# Patient Record
Sex: Female | Born: 1980 | Race: White | Hispanic: No | Marital: Married | Smoking: Former smoker
Health system: Southern US, Community
[De-identification: ages and names within clinical notes are randomized; demographics above are authoritative.]

## PROBLEM LIST (undated history)

## (undated) DIAGNOSIS — E559 Vitamin D deficiency, unspecified: Secondary | ICD-10-CM

## (undated) DIAGNOSIS — J811 Chronic pulmonary edema: Secondary | ICD-10-CM

## (undated) DIAGNOSIS — L814 Other melanin hyperpigmentation: Secondary | ICD-10-CM

## (undated) DIAGNOSIS — O2621 Pregnancy care for patient with recurrent pregnancy loss, first trimester: Secondary | ICD-10-CM

## (undated) DIAGNOSIS — O99810 Abnormal glucose complicating pregnancy: Secondary | ICD-10-CM

## (undated) DIAGNOSIS — I10 Essential (primary) hypertension: Secondary | ICD-10-CM

## (undated) DIAGNOSIS — M5136 Other intervertebral disc degeneration, lumbar region: Secondary | ICD-10-CM

## (undated) DIAGNOSIS — N390 Urinary tract infection, site not specified: Secondary | ICD-10-CM

## (undated) DIAGNOSIS — G4733 Obstructive sleep apnea (adult) (pediatric): Secondary | ICD-10-CM

## (undated) DIAGNOSIS — R000 Tachycardia, unspecified: Secondary | ICD-10-CM

## (undated) DIAGNOSIS — G8929 Other chronic pain: Secondary | ICD-10-CM

## (undated) DIAGNOSIS — R102 Pelvic and perineal pain: Secondary | ICD-10-CM

## (undated) DIAGNOSIS — N939 Abnormal uterine and vaginal bleeding, unspecified: Secondary | ICD-10-CM

## (undated) DIAGNOSIS — Z315 Encounter for genetic counseling: Secondary | ICD-10-CM

## (undated) DIAGNOSIS — Z1589 Genetic susceptibility to other disease: Secondary | ICD-10-CM

## (undated) DIAGNOSIS — F419 Anxiety disorder, unspecified: Secondary | ICD-10-CM

## (undated) DIAGNOSIS — M160 Bilateral primary osteoarthritis of hip: Secondary | ICD-10-CM

## (undated) DIAGNOSIS — M4126 Other idiopathic scoliosis, lumbar region: Secondary | ICD-10-CM

## (undated) DIAGNOSIS — R2243 Localized swelling, mass and lump, lower limb, bilateral: Secondary | ICD-10-CM

## (undated) DIAGNOSIS — N643 Galactorrhea not associated with childbirth: Secondary | ICD-10-CM

## (undated) DIAGNOSIS — N83209 Unspecified ovarian cyst, unspecified side: Secondary | ICD-10-CM

## (undated) DIAGNOSIS — O09521 Supervision of elderly multigravida, first trimester: Secondary | ICD-10-CM

## (undated) DIAGNOSIS — E041 Nontoxic single thyroid nodule: Secondary | ICD-10-CM

## (undated) DIAGNOSIS — E6609 Other obesity due to excess calories: Secondary | ICD-10-CM

## (undated) DIAGNOSIS — I341 Nonrheumatic mitral (valve) prolapse: Secondary | ICD-10-CM

## (undated) DIAGNOSIS — R5381 Other malaise: Secondary | ICD-10-CM

## (undated) DIAGNOSIS — F329 Major depressive disorder, single episode, unspecified: Secondary | ICD-10-CM

## (undated) HISTORY — PX: PARATHYROIDECTOMY: SHX19

---
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
## 2014-05-14 HISTORY — PX: OTHER SURGICAL HISTORY: SHX169

## 2014-08-20 ENCOUNTER — Other Ambulatory Visit (HOSPITAL_COMMUNITY): Payer: Self-pay | Admitting: Obstetrics and Gynecology

## 2014-08-20 DIAGNOSIS — R102 Pelvic and perineal pain: Secondary | ICD-10-CM

## 2014-08-20 DIAGNOSIS — N939 Abnormal uterine and vaginal bleeding, unspecified: Secondary | ICD-10-CM

## 2014-08-25 ENCOUNTER — Ambulatory Visit (HOSPITAL_COMMUNITY)
Admission: RE | Admit: 2014-08-25 | Discharge: 2014-08-25 | Disposition: A | Discharge status: Home / Self Care | Payer: Medicaid Other | Source: Ambulatory Visit | Attending: Obstetrics and Gynecology | Admitting: Obstetrics and Gynecology

## 2014-08-25 ENCOUNTER — Other Ambulatory Visit (HOSPITAL_COMMUNITY): Payer: Self-pay | Admitting: Obstetrics and Gynecology

## 2014-08-25 DIAGNOSIS — R102 Pelvic and perineal pain: Secondary | ICD-10-CM

## 2014-08-25 DIAGNOSIS — D251 Intramural leiomyoma of uterus: Secondary | ICD-10-CM | POA: Diagnosis not present

## 2014-08-25 DIAGNOSIS — N979 Female infertility, unspecified: Secondary | ICD-10-CM | POA: Insufficient documentation

## 2014-08-25 DIAGNOSIS — N939 Abnormal uterine and vaginal bleeding, unspecified: Secondary | ICD-10-CM

## 2014-08-30 ENCOUNTER — Ambulatory Visit (HOSPITAL_COMMUNITY): Payer: Self-pay

## 2014-12-08 ENCOUNTER — Encounter (HOSPITAL_COMMUNITY): Payer: Self-pay | Admitting: Radiology

## 2014-12-08 ENCOUNTER — Ambulatory Visit (HOSPITAL_COMMUNITY): Payer: 59 | Admitting: Anesthesiology

## 2014-12-08 ENCOUNTER — Ambulatory Visit (HOSPITAL_COMMUNITY): Payer: 59

## 2014-12-08 ENCOUNTER — Encounter (HOSPITAL_COMMUNITY)
Admission: RE | Disposition: A | Discharge status: Home / Self Care | Payer: Self-pay | Source: Ambulatory Visit | Attending: Obstetrics and Gynecology

## 2014-12-08 ENCOUNTER — Ambulatory Visit (HOSPITAL_COMMUNITY)
Admission: RE | Admit: 2014-12-08 | Discharge: 2014-12-08 | Disposition: A | Discharge status: Home / Self Care | Payer: 59 | Source: Ambulatory Visit | Attending: Obstetrics and Gynecology | Admitting: Obstetrics and Gynecology

## 2014-12-08 DIAGNOSIS — IMO0002 Reserved for concepts with insufficient information to code with codable children: Secondary | ICD-10-CM

## 2014-12-08 DIAGNOSIS — Z3A12 12 weeks gestation of pregnancy: Secondary | ICD-10-CM | POA: Insufficient documentation

## 2014-12-08 DIAGNOSIS — O021 Missed abortion: Secondary | ICD-10-CM | POA: Diagnosis not present

## 2014-12-08 HISTORY — PX: DILATION AND EVACUATION: SHX1459

## 2014-12-08 LAB — CBC
HCT: 38.6 % (ref 36.0–46.0)
HEMOGLOBIN: 13.1 g/dL (ref 12.0–15.0)
MCH: 31.1 pg (ref 26.0–34.0)
MCHC: 33.9 g/dL (ref 30.0–36.0)
MCV: 91.7 fL (ref 78.0–100.0)
Platelets: 160 10*3/uL (ref 150–400)
RBC: 4.21 MIL/uL (ref 3.87–5.11)
RDW: 12.9 % (ref 11.5–15.5)
WBC: 6.9 10*3/uL (ref 4.0–10.5)

## 2014-12-08 SURGERY — DILATION AND EVACUATION, UTERUS
Anesthesia: General | Site: Vagina

## 2014-12-08 MED ORDER — IBUPROFEN 600 MG PO TABS: 600.0000 mg | ORAL_TABLET | Freq: Four times a day (QID) | ORAL | Status: DC | PRN

## 2014-12-08 MED ORDER — SCOPOLAMINE 1 MG/3DAYS TD PT72: 1.0000 | MEDICATED_PATCH | Freq: Once | TRANSDERMAL | Status: DC

## 2014-12-08 MED ORDER — PROPOFOL 10 MG/ML IV BOLUS
INTRAVENOUS | Status: DC | PRN
  Administered 2014-12-08: 200 mg via INTRAVENOUS

## 2014-12-08 MED ORDER — FENTANYL CITRATE (PF) 100 MCG/2ML IJ SOLN: 25.0000 ug | INTRAMUSCULAR | Status: DC | PRN

## 2014-12-08 MED ORDER — DEXAMETHASONE SODIUM PHOSPHATE 10 MG/ML IJ SOLN
INTRAMUSCULAR | Status: AC
  Filled 2014-12-08: qty 1

## 2014-12-08 MED ORDER — LIDOCAINE HCL 1 % IJ SOLN
INTRAMUSCULAR | Status: DC | PRN
  Administered 2014-12-08: 10 mL

## 2014-12-08 MED ORDER — DEXAMETHASONE SODIUM PHOSPHATE 10 MG/ML IJ SOLN
INTRAMUSCULAR | Status: DC | PRN
  Administered 2014-12-08: 10 mg via INTRAVENOUS

## 2014-12-08 MED ORDER — LIDOCAINE HCL (CARDIAC) 20 MG/ML IV SOLN
INTRAVENOUS | Status: AC
  Filled 2014-12-08: qty 5

## 2014-12-08 MED ORDER — SUCCINYLCHOLINE CHLORIDE 200 MG/10ML IV SOSY
PREFILLED_SYRINGE | INTRAVENOUS | Status: DC | PRN
  Administered 2014-12-08: 80 mg via INTRAVENOUS

## 2014-12-08 MED ORDER — HYDROCODONE-ACETAMINOPHEN 5-325 MG PO TABS: ORAL_TABLET | ORAL | Status: DC

## 2014-12-08 MED ORDER — PROPOFOL 10 MG/ML IV BOLUS
INTRAVENOUS | Status: AC
  Filled 2014-12-08: qty 20

## 2014-12-08 MED ORDER — LIDOCAINE HCL 1 % IJ SOLN
INTRAMUSCULAR | Status: AC
  Filled 2014-12-08: qty 20

## 2014-12-08 MED ORDER — ONDANSETRON HCL 4 MG/2ML IJ SOLN
INTRAMUSCULAR | Status: DC | PRN
  Administered 2014-12-08: 4 mg via INTRAVENOUS

## 2014-12-08 MED ORDER — MIDAZOLAM HCL 5 MG/5ML IJ SOLN
INTRAMUSCULAR | Status: DC | PRN
  Administered 2014-12-08: 2 mg via INTRAVENOUS

## 2014-12-08 MED ORDER — LIDOCAINE HCL (CARDIAC) 20 MG/ML IV SOLN
INTRAVENOUS | Status: DC | PRN
  Administered 2014-12-08: 40 mg via INTRAVENOUS

## 2014-12-08 MED ORDER — ONDANSETRON HCL 4 MG/2ML IJ SOLN
INTRAMUSCULAR | Status: AC
  Filled 2014-12-08: qty 2

## 2014-12-08 MED ORDER — MIDAZOLAM HCL 2 MG/2ML IJ SOLN
INTRAMUSCULAR | Status: AC
  Filled 2014-12-08: qty 2

## 2014-12-08 MED ORDER — LACTATED RINGERS IV SOLN
INTRAVENOUS | Status: DC
  Administered 2014-12-08 (×2): via INTRAVENOUS

## 2014-12-08 MED ORDER — KETOROLAC TROMETHAMINE 30 MG/ML IJ SOLN
INTRAMUSCULAR | Status: DC | PRN
  Administered 2014-12-08: 30 mg via INTRAVENOUS

## 2014-12-08 MED ORDER — FENTANYL CITRATE (PF) 100 MCG/2ML IJ SOLN
INTRAMUSCULAR | Status: DC | PRN
  Administered 2014-12-08: 100 ug via INTRAVENOUS

## 2014-12-08 MED ORDER — FENTANYL CITRATE (PF) 100 MCG/2ML IJ SOLN
INTRAMUSCULAR | Status: AC
  Filled 2014-12-08: qty 2

## 2014-12-08 MED ORDER — ONDANSETRON HCL 4 MG/2ML IJ SOLN
4.0000 mg | Freq: Once | INTRAMUSCULAR | Status: DC | PRN
Start: 2014-12-08 — End: 2014-12-08

## 2014-12-08 MED ORDER — SUCCINYLCHOLINE CHLORIDE 20 MG/ML IJ SOLN
INTRAMUSCULAR | Status: AC
  Filled 2014-12-08: qty 1

## 2014-12-08 SURGICAL SUPPLY — 17 items
CATH ROBINSON RED A/P 16FR (CATHETERS) ×3 IMPLANT
CLOTH BEACON ORANGE TIMEOUT ST (SAFETY) ×3 IMPLANT
DECANTER SPIKE VIAL GLASS SM (MISCELLANEOUS) ×3 IMPLANT
DILATOR CANAL MILEX (MISCELLANEOUS) IMPLANT
GLOVE BIO SURGEON STRL SZ7 (GLOVE) ×3 IMPLANT
GOWN STRL REUS W/TWL LRG LVL3 (GOWN DISPOSABLE) ×6 IMPLANT
KIT BERKELEY 1ST TRIMESTER 3/8 (MISCELLANEOUS) ×3 IMPLANT
NS IRRIG 1000ML POUR BTL (IV SOLUTION) ×3 IMPLANT
PACK VAGINAL MINOR WOMEN LF (CUSTOM PROCEDURE TRAY) ×3 IMPLANT
PAD OB MATERNITY 4.3X12.25 (PERSONAL CARE ITEMS) ×3 IMPLANT
PAD PREP 24X48 CUFFED NSTRL (MISCELLANEOUS) ×3 IMPLANT
SET BERKELEY SUCTION TUBING (SUCTIONS) ×3 IMPLANT
TOWEL OR 17X24 6PK STRL BLUE (TOWEL DISPOSABLE) ×6 IMPLANT
VACURETTE 10 RIGID CVD (CANNULA) ×3 IMPLANT
VACURETTE 7MM CVD STRL WRAP (CANNULA) IMPLANT
VACURETTE 8 RIGID CVD (CANNULA) IMPLANT
VACURETTE 9 RIGID CVD (CANNULA) IMPLANT

## 2014-12-08 NOTE — Anesthesia Procedure Notes (Signed)
Procedure Name: Intubation Date/Time: 12/08/2014 12:07 PM Performed by: Ignacia Bayley Pre-anesthesia Checklist: Patient identified, Emergency Drugs available, Suction available and Patient being monitored Patient Re-evaluated:Patient Re-evaluated prior to inductionOxygen Delivery Method: Circle system utilized Preoxygenation: Pre-oxygenation with 100% oxygen Intubation Type: IV induction Ventilation: Mask ventilation without difficulty Laryngoscope Size: Miller and 2 Grade View: Grade II Tube type: Oral Tube size: 7.0 mm Number of attempts: 1 Airway Equipment and Method: Stylet Placement Confirmation: ETT inserted through vocal cords under direct vision,  positive ETCO2 and breath sounds checked- equal and bilateral Secured at: 20 cm Tube secured with: Tape Dental Injury: Teeth and Oropharynx as per pre-operative assessment

## 2014-12-08 NOTE — Transfer of Care (Signed)
Immediate Anesthesia Transfer of Care Note  Patient: Claire Sharp  Procedure(s) Performed: Procedure(s): DILATATION AND EVACUATION (N/A)  Patient Location: PACU  Anesthesia Type:General  Level of Consciousness: awake  Airway & Oxygen Therapy: Patient Spontanous Breathing and Patient connected to nasal cannula oxygen  Post-op Assessment: Report given to RN and Post -op Vital signs reviewed and stable  Post vital signs: stable  Last Vitals:  Filed Vitals:   12/08/14 1104  BP: 123/79  Pulse: 59  Temp: 36.8 C  Resp: 20    Complications: No apparent anesthesia complications

## 2014-12-08 NOTE — Op Note (Signed)
Pre-Operative Diagnosis:  12 week miscarriage Postoperative Diagnosis: Same Procedure: Suction Dilation and evacuation Surgeon: Dr. Vanessa Kick Assistant: None Operative Findings: 14-16 week sized uterus Specimen: POCs EBL: 200cc   Claire Sharp Is a 34 year old gravida 5 para 3013 who presents for definitive surgical management for 12 week miscarriage. Please see the patient's history and physical for complete details of the history. Management options were discussed with the patient. R/B/A reviewed. Following appropriate informed consent was taken to the operating room. The patient was appropriately identified during a time out procedure. General anesthesia was administered and the patient was placed in the dorsal lithotomy position. The patient was prepped and draped in the normal sterile fashion. A speculum was placed into the vagina, a single-tooth tenaculum was placed on the anterior lip of the cervix, and 10 cc of 1% lidocaine was administered in a paracervical fashion. The cervix was serially dilated with Hank and Pratt dilators.A #12 suction curet was then passed to the fundus, the vacuum was engaged, and 3 suction passes were performed with a curette. A Sharp curettage was performed and a gritty texture was noted. A final suction pass was performed with minimal results. This completed the procedure. The patient tolerated the procedure well was brought to the recovery room in stable condition for the procedure. All sponge and needle counts correct x2.

## 2014-12-08 NOTE — Anesthesia Postprocedure Evaluation (Signed)
  Anesthesia Post-op Note  Patient: Claire Sharp  Procedure(s) Performed: Procedure(s): DILATATION AND EVACUATION (N/A)  Patient Location: PACU  Anesthesia Type:General  Level of Consciousness: awake, alert  and oriented  Airway and Oxygen Therapy: Patient Spontanous Breathing  Post-op Pain: mild  Post-op Assessment: Post-op Vital signs reviewed, Patient's Cardiovascular Status Stable, Respiratory Function Stable, Patent Airway, No signs of Nausea or vomiting and Pain level controlled              Post-op Vital Signs: Reviewed and stable  Last Vitals:  Filed Vitals:   12/08/14 1315  BP: 110/65  Pulse: 72  Temp:   Resp: 16    Complications: No apparent anesthesia complications

## 2014-12-08 NOTE — Anesthesia Preprocedure Evaluation (Signed)
Anesthesia Evaluation  Patient identified by MRN, date of birth, ID band Patient awake    Reviewed: Allergy & Precautions, NPO status , Patient's Chart, lab work & pertinent test results  History of Anesthesia Complications Negative for: history of anesthetic complications  Airway Mallampati: II  TM Distance: >3 FB Neck ROM: Full    Dental no notable dental hx. (+) Dental Advisory Given   Pulmonary neg pulmonary ROS,  breath sounds clear to auscultation  Pulmonary exam normal       Cardiovascular negative cardio ROS Normal cardiovascular examRhythm:Regular Rate:Normal     Neuro/Psych negative neurological ROS  negative psych ROS   GI/Hepatic negative GI ROS, Neg liver ROS,   Endo/Other  negative endocrine ROS  Renal/GU negative Renal ROS  negative genitourinary   Musculoskeletal negative musculoskeletal ROS (+)   Abdominal   Peds negative pediatric ROS (+)  Hematology negative hematology ROS (+)   Anesthesia Other Findings   Reproductive/Obstetrics (+) Pregnancy 12 weeks and 2 days, missed ab                             Anesthesia Physical Anesthesia Plan  ASA: II  Anesthesia Plan: General   Post-op Pain Management:    Induction: Intravenous  Airway Management Planned: Oral ETT  Additional Equipment:   Intra-op Plan:   Post-operative Plan: Extubation in OR  Informed Consent: I have reviewed the patients History and Physical, chart, labs and discussed the procedure including the risks, benefits and alternatives for the proposed anesthesia with the patient or authorized representative who has indicated his/her understanding and acceptance.   Dental advisory given  Plan Discussed with: CRNA  Anesthesia Plan Comments:         Anesthesia Quick Evaluation

## 2014-12-08 NOTE — H&P (Signed)
Claire Sharp is an 34 y.o. female. For suction D&E  34 yo [id number] presents for surgical management of 12 week missed abortion. Pt presented to office for routine prenatal [place] and no fetal cardiac activity was identified. Given advanced gestation the decion was made to proceed with Suction D&E. R/B?A reviewed.  No LMP recorded. Patient is pregnant.    No past medical history on file.  No past surgical history on file.  No family history on file.  Social History:  has no tobacco, alcohol, and drug history on file.  Allergies: No Known Allergies  Prescriptions prior to admission  Medication Sig Dispense Refill Last Dose  . acetaminophen (TYLENOL) 500 MG tablet Take 1,000 mg by mouth every 8 (eight) hours as needed for mild pain or headache.       ROS: As above  There were no vitals taken for this visit. Physical Exam   AoX3, NAD   Results for orders placed or performed during the hospital encounter of 12/08/14 (from the past 24 hour(s))  CBC     Status: None   Collection Time: 12/08/14 10:10 AM  Result Value Ref Range   WBC 6.9 4.0 - 10.5 K/uL   RBC 4.21 3.87 - 5.11 MIL/uL   Hemoglobin 13.1 12.0 - 15.0 g/dL   HCT 38.6 36.0 - 46.0 %   MCV 91.7 78.0 - 100.0 fL   MCH 31.1 26.0 - 34.0 pg   MCHC 33.9 30.0 - 36.0 g/dL   RDW 12.9 11.5 - 15.5 %   Platelets 160 150 - 400 K/uL    No results found.  Assessment/Plan: 1) Suction D&E 2) SCDs to OR  Claire Sharp H. 12/08/2014, 10:40 AM

## 2014-12-09 ENCOUNTER — Encounter (HOSPITAL_COMMUNITY): Payer: Self-pay | Admitting: Obstetrics and Gynecology

## 2016-10-05 DIAGNOSIS — E041 Nontoxic single thyroid nodule: Secondary | ICD-10-CM

## 2016-10-05 DIAGNOSIS — E559 Vitamin D deficiency, unspecified: Secondary | ICD-10-CM

## 2016-10-05 HISTORY — DX: Vitamin D deficiency, unspecified: E55.9

## 2016-10-05 HISTORY — DX: Nontoxic single thyroid nodule: E04.1

## 2017-07-24 DIAGNOSIS — Z6837 Body mass index (BMI) 37.0-37.9, adult: Secondary | ICD-10-CM

## 2017-07-24 DIAGNOSIS — I341 Nonrheumatic mitral (valve) prolapse: Secondary | ICD-10-CM

## 2017-07-24 DIAGNOSIS — R5381 Other malaise: Secondary | ICD-10-CM | POA: Insufficient documentation

## 2017-07-24 DIAGNOSIS — R5383 Other fatigue: Secondary | ICD-10-CM | POA: Insufficient documentation

## 2017-07-24 DIAGNOSIS — E6609 Other obesity due to excess calories: Secondary | ICD-10-CM

## 2017-07-24 DIAGNOSIS — G4733 Obstructive sleep apnea (adult) (pediatric): Secondary | ICD-10-CM | POA: Insufficient documentation

## 2017-07-24 DIAGNOSIS — F419 Anxiety disorder, unspecified: Secondary | ICD-10-CM

## 2017-07-24 DIAGNOSIS — F17200 Nicotine dependence, unspecified, uncomplicated: Secondary | ICD-10-CM

## 2017-07-24 DIAGNOSIS — R000 Tachycardia, unspecified: Secondary | ICD-10-CM

## 2017-07-24 HISTORY — DX: Other malaise: R53.81

## 2017-07-24 HISTORY — DX: Nicotine dependence, unspecified, uncomplicated: F17.200

## 2017-07-24 HISTORY — DX: Anxiety disorder, unspecified: F41.9

## 2017-07-24 HISTORY — DX: Tachycardia, unspecified: R00.0

## 2017-07-24 HISTORY — DX: Morbid (severe) obesity due to excess calories: E66.01

## 2017-07-24 HISTORY — DX: Obstructive sleep apnea (adult) (pediatric): G47.33

## 2017-07-24 HISTORY — DX: Nonrheumatic mitral (valve) prolapse: I34.1

## 2017-07-24 HISTORY — DX: Other obesity due to excess calories: E66.09

## 2017-07-24 HISTORY — DX: Body mass index (BMI) 37.0-37.9, adult: Z68.37

## 2017-07-25 DIAGNOSIS — E7212 Methylenetetrahydrofolate reductase deficiency: Secondary | ICD-10-CM | POA: Insufficient documentation

## 2017-07-25 DIAGNOSIS — Z1589 Genetic susceptibility to other disease: Secondary | ICD-10-CM

## 2017-07-25 HISTORY — DX: Genetic susceptibility to other disease: Z15.89

## 2017-12-05 DIAGNOSIS — L814 Other melanin hyperpigmentation: Secondary | ICD-10-CM

## 2017-12-05 DIAGNOSIS — M5440 Lumbago with sciatica, unspecified side: Secondary | ICD-10-CM

## 2017-12-05 DIAGNOSIS — G8929 Other chronic pain: Secondary | ICD-10-CM | POA: Insufficient documentation

## 2017-12-05 HISTORY — DX: Other chronic pain: G89.29

## 2017-12-05 HISTORY — DX: Lumbago with sciatica, unspecified side: M54.40

## 2017-12-05 HISTORY — DX: Other melanin hyperpigmentation: L81.4

## 2017-12-06 DIAGNOSIS — M5136 Other intervertebral disc degeneration, lumbar region: Secondary | ICD-10-CM

## 2017-12-06 DIAGNOSIS — M160 Bilateral primary osteoarthritis of hip: Secondary | ICD-10-CM

## 2017-12-06 DIAGNOSIS — M4126 Other idiopathic scoliosis, lumbar region: Secondary | ICD-10-CM

## 2017-12-06 HISTORY — DX: Bilateral primary osteoarthritis of hip: M16.0

## 2017-12-06 HISTORY — DX: Other idiopathic scoliosis, lumbar region: M41.26

## 2017-12-06 HISTORY — DX: Other intervertebral disc degeneration, lumbar region: M51.36

## 2018-04-04 DIAGNOSIS — F32A Depression, unspecified: Secondary | ICD-10-CM | POA: Insufficient documentation

## 2018-04-04 DIAGNOSIS — Z87891 Personal history of nicotine dependence: Secondary | ICD-10-CM

## 2018-04-04 DIAGNOSIS — Z98891 History of uterine scar from previous surgery: Secondary | ICD-10-CM

## 2018-04-04 DIAGNOSIS — O2621 Pregnancy care for patient with recurrent pregnancy loss, first trimester: Secondary | ICD-10-CM

## 2018-04-04 DIAGNOSIS — O09521 Supervision of elderly multigravida, first trimester: Secondary | ICD-10-CM

## 2018-04-04 DIAGNOSIS — Z1589 Genetic susceptibility to other disease: Secondary | ICD-10-CM

## 2018-04-04 DIAGNOSIS — F329 Major depressive disorder, single episode, unspecified: Secondary | ICD-10-CM | POA: Insufficient documentation

## 2018-04-04 HISTORY — DX: Personal history of nicotine dependence: Z87.891

## 2018-04-04 HISTORY — DX: Genetic susceptibility to other disease: Z15.89

## 2018-04-04 HISTORY — DX: Pregnancy care for patient with recurrent pregnancy loss, first trimester: O26.21

## 2018-04-04 HISTORY — DX: History of uterine scar from previous surgery: Z98.891

## 2018-04-04 HISTORY — DX: Supervision of elderly multigravida, first trimester: O09.521

## 2018-04-04 HISTORY — DX: Depression, unspecified: F32.A

## 2018-05-02 DIAGNOSIS — Z315 Encounter for genetic counseling: Secondary | ICD-10-CM

## 2018-05-02 HISTORY — DX: Encounter for procreative genetic counseling: Z31.5

## 2018-08-11 DIAGNOSIS — O99810 Abnormal glucose complicating pregnancy: Secondary | ICD-10-CM

## 2018-08-11 HISTORY — DX: Abnormal glucose complicating pregnancy: O99.810

## 2018-10-27 DIAGNOSIS — R2243 Localized swelling, mass and lump, lower limb, bilateral: Secondary | ICD-10-CM

## 2018-10-27 HISTORY — DX: Localized swelling, mass and lump, lower limb, bilateral: R22.43

## 2018-11-01 DIAGNOSIS — O165 Unspecified maternal hypertension, complicating the puerperium: Secondary | ICD-10-CM

## 2018-11-01 DIAGNOSIS — J811 Chronic pulmonary edema: Secondary | ICD-10-CM

## 2018-11-01 HISTORY — DX: Chronic pulmonary edema: J81.1

## 2018-11-01 HISTORY — DX: Unspecified maternal hypertension, complicating the puerperium: O16.5

## 2018-12-09 DIAGNOSIS — I10 Essential (primary) hypertension: Secondary | ICD-10-CM

## 2018-12-09 HISTORY — DX: Essential (primary) hypertension: I10

## 2019-03-05 ENCOUNTER — Other Ambulatory Visit: Payer: Self-pay | Admitting: *Deleted

## 2019-03-05 ENCOUNTER — Encounter: Payer: Self-pay | Admitting: *Deleted

## 2019-03-06 ENCOUNTER — Encounter: Payer: Self-pay | Admitting: *Deleted

## 2019-03-06 ENCOUNTER — Ambulatory Visit (INDEPENDENT_AMBULATORY_CARE_PROVIDER_SITE_OTHER): Payer: 59 | Admitting: Cardiology

## 2019-03-06 ENCOUNTER — Telehealth: Payer: Self-pay | Admitting: Cardiology

## 2019-03-06 ENCOUNTER — Other Ambulatory Visit: Payer: Self-pay

## 2019-03-06 ENCOUNTER — Ambulatory Visit (INDEPENDENT_AMBULATORY_CARE_PROVIDER_SITE_OTHER): Payer: 59

## 2019-03-06 VITALS — BP 130/100 | HR 82 | Ht 63.0 in | Wt 187.0 lb

## 2019-03-06 DIAGNOSIS — I5032 Chronic diastolic (congestive) heart failure: Secondary | ICD-10-CM

## 2019-03-06 DIAGNOSIS — E782 Mixed hyperlipidemia: Secondary | ICD-10-CM

## 2019-03-06 DIAGNOSIS — N390 Urinary tract infection, site not specified: Secondary | ICD-10-CM | POA: Insufficient documentation

## 2019-03-06 DIAGNOSIS — N83209 Unspecified ovarian cyst, unspecified side: Secondary | ICD-10-CM | POA: Insufficient documentation

## 2019-03-06 DIAGNOSIS — R0600 Dyspnea, unspecified: Secondary | ICD-10-CM | POA: Insufficient documentation

## 2019-03-06 DIAGNOSIS — I10 Essential (primary) hypertension: Secondary | ICD-10-CM

## 2019-03-06 DIAGNOSIS — N939 Abnormal uterine and vaginal bleeding, unspecified: Secondary | ICD-10-CM

## 2019-03-06 DIAGNOSIS — R002 Palpitations: Secondary | ICD-10-CM

## 2019-03-06 DIAGNOSIS — R079 Chest pain, unspecified: Secondary | ICD-10-CM | POA: Diagnosis not present

## 2019-03-06 DIAGNOSIS — N643 Galactorrhea not associated with childbirth: Secondary | ICD-10-CM | POA: Insufficient documentation

## 2019-03-06 DIAGNOSIS — R102 Pelvic and perineal pain: Secondary | ICD-10-CM | POA: Insufficient documentation

## 2019-03-06 DIAGNOSIS — R9431 Abnormal electrocardiogram [ECG] [EKG]: Secondary | ICD-10-CM | POA: Insufficient documentation

## 2019-03-06 HISTORY — DX: Unspecified ovarian cyst, unspecified side: N83.209

## 2019-03-06 HISTORY — DX: Pelvic and perineal pain: R10.2

## 2019-03-06 HISTORY — DX: Chronic diastolic (congestive) heart failure: I50.32

## 2019-03-06 HISTORY — DX: Palpitations: R00.2

## 2019-03-06 HISTORY — DX: Galactorrhea not associated with childbirth: N64.3

## 2019-03-06 HISTORY — DX: Urinary tract infection, site not specified: N39.0

## 2019-03-06 HISTORY — DX: Mixed hyperlipidemia: E78.2

## 2019-03-06 HISTORY — DX: Abnormal uterine and vaginal bleeding, unspecified: N93.9

## 2019-03-06 HISTORY — DX: Abnormal electrocardiogram (ECG) (EKG): R94.31

## 2019-03-06 HISTORY — DX: Dyspnea, unspecified: R06.00

## 2019-03-06 MED ORDER — FUROSEMIDE 20 MG PO TABS: ORAL_TABLET | ORAL | 2 refills | Status: AC

## 2019-03-06 MED ORDER — METOPROLOL TARTRATE 50 MG PO TABS: ORAL_TABLET | ORAL | 0 refills | Status: DC

## 2019-03-06 NOTE — Telephone Encounter (Signed)
Telephone call to  [place]. Left message to return call.

## 2019-03-06 NOTE — Patient Instructions (Signed)
Medication Instructions:  Your physician has recommended you make the following change in your medication:   INCREASE: Furosemide to 40 mg (2 tab) in the AM and 20 mg (1 tab) in the PM  *If you need a refill on your cardiac medications before your next appointment, please call your pharmacy*  Lab Work: Your physician recommends that you return for lab work in: TODAY BMP,Magnesium,TSH,LIPID,Pro BNP  3-7 days prior to CT: BMP  If you have labs (blood work) drawn today and your tests are completely normal, you will receive your results only by:  [place] (if you have MyChart) OR  A paper copy in the mail If you have any lab test that is abnormal or we need to change your treatment, we will call you to review the results.  Testing/Procedures: Your physician has requested that you have an echocardiogram. Echocardiography is a painless test that uses sound waves to create images of your heart. It provides your doctor with information about the size and shape of your heart and how well your hearts chambers and valves are working. This procedure takes approximately one hour. There are no restrictions for this procedure.  A zio monitor was placed today. It will remain on for 7 days. You will then return monitor and event diary in provided box. It takes 1-2 weeks for report to be downloaded and returned to [place]. We will call you with the results. If monitor falls off or has orange flashing light, please call [employer] for further instructions.   Your physician has requested that you have cardiac CT. Cardiac computed tomography (CT) is a painless test that uses an x-ray machine to take clear, detailed pictures of your heart. For further information please visit [email or web address]. Please follow instruction sheet as given.  Your cardiac CT will be scheduled at one of the below locations:   [facility] [address] [place], [place] [address] [phone]  If scheduled at [facility], please arrive at the [facility] main entrance of [facility] 30-45 minutes prior to test start time. Proceed to the [facility] Radiology Department (first floor) to check-in and test prep.   Please follow these instructions carefully (unless otherwise directed):    On the Night Before the Test:  Be sure to Drink plenty of water.  Do not consume any caffeinated/decaffeinated beverages or chocolate 12 hours prior to your test.  Do not take any antihistamines 12 hours prior to your test.  On the Day of the Test:  Drink plenty of water. Do not drink any water within one hour of the test.  Do not eat any food 4 hours prior to the test.  You may take your regular medications prior to the test.   Take metoprolol (Lopressor) two hours prior to test.  HOLD Furosemide morning of the test.  FEMALES- please wear underwire-free bra if available                  -If HR is less than 55 BPM- No Beta Blocker                -IF HR is greater than 55 BPM and patient is less than or equal to 40 yrs old Lopressor 100mg  x1.  After the Test:  Drink plenty of water.  After receiving IV contrast, you may experience a mild flushed feeling. This is normal.  On occasion, you may experience a mild rash up to 24 hours after the test.  This is not dangerous. If this occurs, you can take Benadryl 25 mg and increase your fluid intake.  If you experience trouble breathing, this can be serious. If it is severe call [phone] IMMEDIATELY. If it is mild, please call our office.   Once we have confirmed authorization from your insurance company, we will call you to set up a date and time for your test.   For non-scheduling related questions, please contact the cardiac imaging nurse navigator should you have any questions/concerns: Marchia Bond, RN Navigator Cardiac Imaging [facility] [phone] Office    Follow-Up: At [facility], you and your health  needs are our priority.  As part of our continuing mission to provide you with exceptional heart care, we have created designated Provider Care Teams.  These Care Teams include your primary Cardiologist (physician) and Advanced Practice Providers (APPs -  Physician Assistants and Nurse Practitioners) who all work together to provide you with the care you need, when you need it.  Your next appointment:   2 months  The format for your next appointment:   In Person  Provider:   Berniece Salines, DO  Other Instructions    Cardiac CT Angiogram  A cardiac CT angiogram is a procedure to look at the heart and the area around the heart. It may be done to help find the cause of chest pains or other symptoms of heart disease. During this procedure, a large X-ray machine, called a CT scanner, takes detailed pictures of the heart and the surrounding area after a dye (contrast material) has been injected into blood vessels in the area. The procedure is also sometimes called a coronary CT angiogram, coronary artery scanning, or CTA. A cardiac CT angiogram allows the health care provider to see how well blood is flowing to and from the heart. The health care provider will be able to see if there are any problems, such as:  Blockage or narrowing of the coronary arteries in the heart.  Fluid around the heart.  Signs of weakness or disease in the muscles, valves, and tissues of the heart. Tell a health care provider about:  Any allergies you have. This is especially important if you have had a previous allergic reaction to contrast dye.  All medicines you are taking, including vitamins, herbs, eye drops, creams, and over-the-counter medicines.  Any blood disorders you have.  Any surgeries you have had.  Any medical conditions you have.  Whether you are pregnant or may be pregnant.  Any anxiety disorders, chronic pain, or other conditions you have that may increase your stress or prevent you from lying  still. What are the risks? Generally, this is a safe procedure. However, problems may occur, including:  Bleeding.  Infection.  Allergic reactions to medicines or dyes.  Damage to other structures or organs.  Kidney damage from the dye or contrast that is used.  Increased risk of cancer from radiation exposure. This risk is low. Talk with your health care provider about: ? The risks and benefits of testing. ? How you can receive the lowest dose of radiation. What happens before the procedure?  Wear comfortable clothing and remove any jewelry, glasses, dentures, and hearing aids.  Follow instructions from your health care provider about eating and drinking. This may include: ? For 12 hours before the test -- avoid caffeine. This includes tea, coffee, soda, energy drinks, and diet pills. Drink plenty of water or other fluids that do not have caffeine  in them. Being well-hydrated can prevent complications. ? For 4-6 hours before the test -- stop eating and drinking. The contrast dye can cause nausea, but this is less likely if your stomach is empty.  Ask your health care provider about changing or stopping your regular medicines. This is especially important if you are taking diabetes medicines, blood thinners, or medicines to treat erectile dysfunction. What happens during the procedure?  Hair on your chest may need to be removed so that small sticky patches called electrodes can be placed on your chest. These will transmit information that helps to monitor your heart during the test.  An IV tube will be inserted into one of your veins.  You might be given a medicine to control your heart rate during the test. This will help to ensure that good images are obtained.  You will be asked to lie on an exam table. This table will slide in and out of the CT machine during the procedure.  Contrast dye will be injected into the IV tube. You might feel warm, or you may get a metallic taste in  your mouth.  You will be given a medicine (nitroglycerin) to relax (dilate) the arteries in your heart.  The table that you are lying on will move into the CT machine tunnel for the scan.  The person running the machine will give you instructions while the scans are being done. You may be asked to: ? Keep your arms above your head. ? Hold your breath. ? Stay very still, even if the table is moving.  When the scanning is complete, you will be moved out of the machine.  The IV tube will be removed. The procedure may vary among health care providers and hospitals. What happens after the procedure?  You might feel warm, or you may get a metallic taste in your mouth from the contrast dye.  You may have a headache from the nitroglycerin.  After the procedure, drink water or other fluids to wash (flush) the contrast material out of your body.  Contact a health care provider if you have any symptoms of allergy to the contrast. These symptoms include: ? Shortness of breath. ? Rash or hives. ? A racing heartbeat.  Most people can return to their normal activities right after the procedure. Ask your health care provider what activities are safe for you.  It is up to you to get the results of your procedure. Ask your health care provider, or the department that is doing the procedure, when your results will be ready. Summary  A cardiac CT angiogram is a procedure to look at the heart and the area around the heart. It may be done to help find the cause of chest pains or other symptoms of heart disease.  During this procedure, a large X-ray machine, called a CT scanner, takes detailed pictures of the heart and the surrounding area after a dye (contrast material) has been injected into blood vessels in the area.  Ask your health care provider about changing or stopping your regular medicines before the procedure. This is especially important if you are taking diabetes medicines, blood thinners,  or medicines to treat erectile dysfunction.  After the procedure, drink water or other fluids to wash (flush) the contrast material out of your body. This information is not intended to replace advice given to you by your health care provider. Make sure you discuss any questions you have with your health care provider. Document Released: 04/12/2008  Document Revised: 04/12/2017 Document Reviewed: 03/19/2016 Elsevier Patient Education  [employer].  Echocardiogram An echocardiogram is a procedure that uses painless sound waves (ultrasound) to produce an image of the heart. Images from an echocardiogram can provide important information about:  Signs of coronary artery disease (CAD).  Aneurysm detection. An aneurysm is a weak or damaged part of an artery wall that bulges out from the normal force of blood pumping through the body.  Heart size and shape. Changes in the size or shape of the heart can be associated with certain conditions, including heart failure, aneurysm, and CAD.  Heart muscle function.  Heart valve function.  Signs of a past heart attack.  Fluid buildup around the heart.  Thickening of the heart muscle.  A tumor or infectious growth around the heart valves. Tell a health care provider about:  Any allergies you have.  All medicines you are taking, including vitamins, herbs, eye drops, creams, and over-the-counter medicines.  Any blood disorders you have.  Any surgeries you have had.  Any medical conditions you have.  Whether you are pregnant or may be pregnant. What are the risks? Generally, this is a safe procedure. However, problems may occur, including:  Allergic reaction to dye (contrast) that may be used during the procedure. What happens before the procedure? No specific preparation is needed. You may eat and drink normally. What happens during the procedure?   An IV tube may be inserted into one of your veins.  You may receive contrast  through this tube. A contrast is an injection that improves the quality of the pictures from your heart.  A gel will be applied to your chest.  A wand-like tool (transducer) will be moved over your chest. The gel will help to transmit the sound waves from the transducer.  The sound waves will harmlessly bounce off of your heart to allow the heart images to be captured in real-time motion. The images will be recorded on a computer. The procedure may vary among health care providers and hospitals. What happens after the procedure?  You may return to your normal, everyday life, including diet, activities, and medicines, unless your health care provider tells you not to do that. Summary  An echocardiogram is a procedure that uses painless sound waves (ultrasound) to produce an image of the heart.  Images from an echocardiogram can provide important information about the size and shape of your heart, heart muscle function, heart valve function, and fluid buildup around your heart.  You do not need to do anything to prepare before this procedure. You may eat and drink normally.  After the echocardiogram is completed, you may return to your normal, everyday life, unless your health care provider tells you not to do that. This information is not intended to replace advice given to you by your health care provider. Make sure you discuss any questions you have with your health care provider. Document Released: 04/27/2000 Document Revised: 08/21/2018 Document Reviewed: 06/02/2016 Elsevier Patient Education  2020 [employer].

## 2019-03-06 NOTE — Telephone Encounter (Signed)
Please call [place] about echo

## 2019-03-06 NOTE — Progress Notes (Addendum)
Cardiology Office Note:    Date:  03/06/2019   ID:  Claire Sharp, DOB October 14, 1980, MRN [id number]  PCP:  Raina Mina., MD  Cardiologist:  Berniece Salines, DO  Electrophysiologist:  None   Referring MD: Vanessa Kick, MD   Chief Complaint  Patient presents with  . Edema  . Shortness of Breath    History of Present Illness:    Claire Sharp is a 38 y.o. female with a hx of hypertension, postpartum cardiomyopathy obesity: Obstructive sleep apnea, mitral valve prolapse presents to be evaluated for intermittent chest pain with worsening shortness of breath .  Pressure-like feeling in the center 210 without radiation.  She states that the pressure is usually in her midsternal area.  She feels somebody comes on it for like someone is pushing some of her chest this will go for a few seconds prior to resolution.  She admits to associated shortness of breath.  The patient tells me that she was diagnosed with hypertension during her pregnancy which time she was treated and was able to undergo a successful C-section.  She did note that postpartum she experience bilateral leg edema as well as shortness of breath.  She states that on her initial admission she was diuresed significantly she did get an echocardiogram which reported a mitral valve disease and she was discharged to home.  She was started on nifedipine 30 mg daily and Lasix 20 mg daily.  She has been taking these medications however she did not think that the Lasix was helping as she was getting progressively short of breath.  She therefore did see her primary care provider who increased the Lasix to twice daily.  And referred the patient to cardiology.  In addition to the shortness of breath and leg edema she tells me that she also has been experiencing intermittent palpitations.  She described these as that increasing heart rate which began with a rapid onset last for few minutes with a rapid onset.  She admits to intermittent dizziness with  this.   Past Medical History:  Diagnosis Date  . Abnormal uterine bleeding 03/06/2019  . Anxiety disorder 07/24/2017   Alprazolam 15 pills. for 3 months or more.  . Chronic left-sided low back pain with sciatica 12/05/2017  . Class 1 obesity due to excess calories without serious comorbidity with body mass index (BMI) of 31.0 to 31.9 in adult 07/24/2017  . Degenerative lumbar disc 12/06/2017  . Depression 04/04/2018   -History of depression for which she prior was on Wellbutrin and Xanax PRN -Wellbutrin was started to help her with both depression as well as smoking cessation but she stopped this a while back -Stopped PRN xanax once she conceived -EPDS 1 today -Offered referral to perinatal psych for ongoing management but she declines.  Last Assessment & Plan:  Doing well today, mood reported as good.  . Encounter for procreative genetic counseling 05/02/2018   Genetic counseling visit on 05/09/18  Aneuploidy screening/ testing:  cfDNA ([facility] Non-invasive Prenatal Screening); results negative: no aneuploidy detected. Fetal fraction 10%.  Carrier screening:  [x]  Cystic fibrosis - negative x 60 mutations [x]  Spinal muscular atrophy - analysis failed. Notified patient. Declines redraw. [x]  Hemoglobinopathy screening - normal adult hemoglobin  [x]  Karyot  . Encounter for supervision of elderly multigravida in first trimester, antepartum 04/04/2018   Dating: L/7  MOD: RCS at 39 weeks [ ]  schedule Baby name: [ ]    FWB/Screening:  [-] Carrier screening: CF/SMA/Hgb EP - discussed 11/21, declined >  saw GC and opted for testing. CF/Hgb electrophoresis normal. SMA was unable to be completed (unclear reason; sample failed post amplification quality requirements). [-] Patient notified by [facility] and declines redraw [x]  Aneuploidy screening - NIPT neg [x]  A  . Essential hypertension 12/09/2018  . Galactorrhea not associated with childbirth 03/06/2019  . Glucose intolerance of pregnancy 08/11/2018   3/30: 1 hr GTT  162 > [x]  3 hr GTT - 102/189/126/104. Fasting draw, however, was obtained 10/15 after administration of glucose and thus likely abnormally elevated. Would say patient passed test with only one abnormal level. Message out to nursing staff to notify patient, roffer nutrition, 08/19/18 [ ]   . History of MTHFR mutation 04/04/2018   -Identified during work-up for RPL at [facility].  -Already on PNV w/ folic acid supplementation.  -No need for further intervention  Last Assessment & Plan:  -Identified during work-up for RPL at [facility].  -Already on PNV w/ folic acid supplementation.  -No need for further intervention  . Lentigo 12/05/2017  . Localized swelling, mass, or lump of lower extremity, bilateral 10/27/2018   Postpartum admission for orthopnea and LE swelling, BNP 990. Echo normal, no signs of heart failure. Patient remained HDS through hospital stay and discharge after diuresis and improved symptoms on 10/27/2018  . Malaise and fatigue 07/24/2017  . MVP (mitral valve prolapse) 07/24/2017  . Obstructive sleep apnea syndrome 07/24/2017   HST 09/02/17 AHI 5.7  . Other idiopathic scoliosis, lumbar region 12/06/2017  . Pain in pelvis 03/06/2019  . Pregnancy-induced hypertension with pulmonary edema 11/01/2018  . Primary osteoarthritis of both hips 12/06/2017   Seen on xrays 11/2017  . 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 Ruptured cyst of ovary 03/06/2019  . Tachycardia 07/24/2017  . Thyroid nodule 10/05/2016   Reported okay.  Would like updated  . Urinary tract  infectious disease 03/06/2019  . Vitamin D deficiency 10/05/2016    Past Surgical History:  Procedure Laterality Date  . CESAREAN SECTION     05/24/06 and 12/10/02  . DILATION AND EVACUATION N/A 12/08/2014   Procedure: DILATATION AND EVACUATION;  Surgeon: Vanessa Kick, MD;  Location: [facility];  Service: Gynecology;  Laterality: N/A;  . PARATHYROIDECTOMY      Current Medications: Current Meds  Medication Sig  . ALPRAZolam (XANAX) 0.5 MG tablet Take 0.5 mg by mouth at bedtime as needed.  . furosemide (LASIX) 20 MG tablet Take 40 mg (2 tabs) in the AM and 20 mg (1 tab) in the PM  . ibuprofen (ADVIL,MOTRIN) 600 MG tablet Take 1 tablet (600 mg total) by mouth every 6 (six) hours as needed.  Marland Kitchen NIFEdipine (PROCARDIA-XL/NIFEDICAL-XL) 30 MG 24 hr tablet Take 30 mg by mouth daily.  . [DISCONTINUED] furosemide (LASIX) 20 MG tablet Take 20 mg by mouth 2 (two) times daily.     Allergies:   Patient has no known allergies.   Social History   Socioeconomic History  . Marital status: Married    Spouse name: Not  on file  . Number of children: Not on file  . Years of education: Not on file  . Highest education level: Not on file  Occupational History  . Not on file  Social Needs  . Financial resource strain: Not on file  . Food insecurity    Worry: Not on file    Inability: Not on file  . Transportation needs    Medical: Not on file    Non-medical: Not on file  Tobacco Use  . Smoking status: Former Smoker    Quit date: 03/21/2018    Years since quitting: 0.9  . Smokeless tobacco: Never Used  Substance and Sexual Activity  . Alcohol use: Not Currently  . Drug use: Never  . Sexual activity: Not on file  Lifestyle  . Physical activity    Days per week: Not on file    Minutes per session: Not on file  . Stress: Not on file  Relationships  . Social Herbalist on phone: Not on file    Gets together: Not on file    Attends religious service: Not on file    Active member of club  or organization: Not on file    Attends meetings of clubs or organizations: Not on file    Relationship status: Not on file  Other Topics Concern  . Not on file  Social History Narrative  . Not on file     Family History: The patient's family history includes Aneurysm in her maternal grandfather; Coronary artery disease in her father and mother; Heart attack in her maternal grandfather; Hyperlipidemia in her father; Hypertension in her father and mother; Liver disease in her mother; Rheum arthritis in her sister; Scoliosis in her sister.  ROS:   Review of Systems  Constitution: Negative for decreased appetite, fever and weight gain.  HENT: Negative for congestion, ear discharge, hoarse voice and sore throat.   Eyes: Negative for discharge, redness, vision loss in right eye and visual halos.  Cardiovascular: Reports chest pain, dyspnea on exertion, And palpitations.  Negative for orthopnea.  Respiratory: Negative for cough, hemoptysis, and snoring.   Endocrine: Negative for heat intolerance and polyphagia.  Hematologic/Lymphatic: Negative for bleeding problem. Does not bruise/bleed easily.  Skin: Negative for flushing, nail changes, rash and suspicious lesions.  Musculoskeletal: Negative for arthritis, joint pain, muscle cramps, myalgias, neck pain and stiffness.  Gastrointestinal: Negative for abdominal pain, bowel incontinence, diarrhea and excessive appetite.  Genitourinary: Negative for decreased libido, genital sores and incomplete emptying.  Neurological: Negative for brief paralysis, focal weakness, headaches and loss of balance.  Psychiatric/Behavioral: Negative for altered mental status, depression and suicidal ideas.  Allergic/Immunologic: Negative for HIV exposure and persistent infections.    EKGs/Labs/Other Studies Reviewed:    The following studies were reviewed today:   EKG:  The ekg ordered today demonstrates sinus rhythm, with occasional PVC, heart rate 73 bpm,  left atrial enlargement, incomplete right bundle branch block with Q waves in V1 V2 cannot rule out septal MI.  Transthoracic echocardiogram performed in June 2020 which report normal LV systolic function, visually estimated ejection fraction greater than 55%.  Degenerative mitral valve disease.  Mitral regurgitation.  Mildly elevated pulmonary pressure.  Normal right physical systolic function.  The aortic valve was normal without stenosis or regurgitation.  Was normal without stenosis or regurgitation.   Recent Labs: No results found for requested labs within last 8760 hours.  Recent Lipid Panel No results found for: CHOL, TRIG, HDL, CHOLHDL,  VLDL, LDLCALC, LDLDIRECT  Physical Exam:    VS:  BP (!) 130/100 (BP Location: Right Arm, Patient Position: Sitting, Cuff Size: Normal)   Pulse 82   Ht 5\' 3"  (1.6 m)   Wt 187 lb (84.8 kg)   SpO2 99%   BMI 33.13 kg/m     Wt Readings from Last 3 Encounters:  03/06/19 187 lb (84.8 kg)  12/08/14 165 lb (74.8 kg)     GEN: Well nourished, well developed in no acute distress HEENT: Normal NECK: No JVD; No carotid bruits LYMPHATICS: No lymphadenopathy CARDIAC: S1S2 noted,RRR, no murmurs, rubs, gallops RESPIRATORY:  Clear to auscultation without rales, wheezing or rhonchi  ABDOMEN: Soft, non-tender, non-distended, +bowel sounds, no guarding. EXTREMITIES: Bilateral leg edema, No cyanosis, no clubbing MUSCULOSKELETAL:  No deformity  SKIN: Warm and dry NEUROLOGIC:  Alert and oriented x 3, non-focal PSYCHIATRIC:  Normal affect, good insight  ASSESSMENT:    1. Chest pain, unspecified type   2. Dyspnea, unspecified type   3. Palpitations   4. Essential hypertension   5. Chronic heart failure with preserved ejection fraction (HCC)   6. Mixed hyperlipidemia   7. Abnormal EKG    PLAN:    1.  Her worsening symptoms of chest pain shortness of breath is worrisome given her risk factors which include hypertension, premature family history  hyperlipidemia, vascular evaluation will be appropriate.  A CTA coronary has been ordered.  I discussed with the patient she does not have any allergies to contrast.  She is agreeable to proceed with this testing.   2.  In light of her worsening shortness of breath going to repeat her transthoracic echocardiogram to assess RV and LV function.  As well as procedures and any valvular abnormalities.  3.  A ZIO patch will be placed on patient today and she will wear this for 7 days.  4.  She does have bilateral leg edema she takes Lasix 20 mg twice daily.  At this time I will like to increase the Lasix to 40 mg in the morning and 20 mg in the evening.  Last patient a week or so daily as well at that her blood pressure daily.  5.  Blood work will be performed today.  Includes BMP, proBNP, lipid profile and TSH.  6.  Her blood pressure is elevated in the office today.  Given the increase in Lasix dosing will monitor and see if there is any to address further.  7.  Once testing her lipid profile and review recommendations will be made for statin use.     The patient is in agreement with the above plan. The patient left the office in stable condition.  The patient will follow up in 2 months.   Medication Adjustments/Labs and Tests Ordered: Current medicines are reviewed at length with the patient today.  Concerns regarding medicines are outlined above.  Orders Placed This Encounter  Procedures  . CT CORONARY FRACTIONAL FLOW RESERVE DATA PREP  . CT CORONARY FRACTIONAL FLOW RESERVE FLUID ANALYSIS  . CT CORONARY MORPH W/CTA COR W/SCORE W/CA W/CM &/OR WO/CM  . Basic Metabolic Panel (BMET)  . Basic Metabolic Panel (BMET)  . Magnesium  . TSH  . Lipid Profile  . Pro b natriuretic peptide  . LONG TERM MONITOR (3-14 DAYS)  . EKG 12-Lead  . ECHOCARDIOGRAM COMPLETE   Meds ordered this encounter  Medications  . metoprolol tartrate (LOPRESSOR) 50 MG tablet    Sig: Take 2 tabs (100mg ) 2 hours prior  to CT if heart rate is greater than 55    Dispense:  2 tablet    Refill:  0  . furosemide (LASIX) 20 MG tablet    Sig: Take 40 mg (2 tabs) in the AM and 20 mg (1 tab) in the PM    Dispense:  60 tablet    Refill:  2    Patient Instructions  Medication Instructions:  Your physician has recommended you make the following change in your medication:   INCREASE: Furosemide to 40 mg (2 tab) in the AM and 20 mg (1 tab) in the PM  *If you need a refill on your cardiac medications before your next appointment, please call your pharmacy*  Lab Work: Your physician recommends that you return for lab work in: TODAY BMP,Magnesium,TSH,LIPID,Pro BNP  3-7 days prior to CT: BMP  If you have labs (blood work) drawn today and your tests are completely normal, you will receive your results only by: Marland Kitchen MyChart Message (if you have MyChart) OR . A paper copy in the mail If you have any lab test that is abnormal or we need to change your treatment, we will call you to review the results.  Testing/Procedures: Your physician has requested that you have an echocardiogram. Echocardiography is a painless test that uses sound waves to create images of your heart. It provides your doctor with information about the size and shape of your heart and how well your heart's chambers and valves are working. This procedure takes approximately one hour. There are no restrictions for this procedure.  A zio monitor was placed today. It will remain on for 7 days. You will then return monitor and event diary in provided box. It takes 1-2 weeks for report to be downloaded and returned to [place]. We will call you with the results. If monitor falls off or has orange flashing light, please call Zio for further instructions.   Your physician has requested that you have cardiac CT. Cardiac computed tomography (CT) is a painless test that uses an x-ray machine to take clear, detailed pictures of your heart. For further information please  visit [email or web address]. Please follow instruction sheet as given.  Your cardiac CT will be scheduled at one of the below locations:   [facility] [address] [place], [place] [address] [phone]  If scheduled at [facility], please arrive at the [facility] main entrance of [facility] 30-45 minutes prior to test start time. Proceed to the [facility] Radiology Department (first floor) to check-in and test prep.   Please follow these instructions carefully (unless otherwise directed):    On the Night Before the Test: . Be sure to Drink plenty of water. . Do not consume any caffeinated/decaffeinated beverages or chocolate 12 hours prior to your test. . Do not take any antihistamines 12 hours prior to your test.  On the Day of the Test: . Drink plenty of water. Do not drink any water within one hour of the test. . Do not eat any food 4 hours prior to the test. . You may take your regular medications prior to the test.  . Take metoprolol (Lopressor) two hours prior to test. . HOLD Furosemide morning of the test. . FEMALES- please wear underwire-free bra if available                  -If HR is less than 55 BPM- No Beta Blocker                -  IF HR is greater than 55 BPM and patient is less than or equal to 14 yrs old Lopressor 100mg  x1.  After the Test: . Drink plenty of water. . After receiving IV contrast, you may experience a mild flushed feeling. This is normal. . On occasion, you may experience a mild rash up to 24 hours after the test. This is not dangerous. If this occurs, you can take Benadryl 25 mg and increase your fluid intake. . If you experience trouble breathing, this can be serious. If it is severe call 911 IMMEDIATELY. If it is mild, please call our office.   Once we have confirmed authorization from your insurance company, we will call you to set up a date and time for your test.   For non-scheduling related questions,  please contact the cardiac imaging nurse navigator should you have any questions/concerns: Marchia Bond, RN Navigator Cardiac Imaging [facility] [phone] Office    Follow-Up: At [facility], you and your health needs are our priority.  As part of our continuing mission to provide you with exceptional heart care, we have created designated Provider Care Teams.  These Care Teams include your primary Cardiologist (physician) and Advanced Practice Providers (APPs -  Physician Assistants and Nurse Practitioners) who all work together to provide you with the care you need, when you need it.  Your next appointment:   2 months  The format for your next appointment:   In Person  Provider:   Berniece Salines, DO  Other Instructions    Cardiac CT Angiogram  A cardiac CT angiogram is a procedure to look at the heart and the area around the heart. It may be done to help find the cause of chest pains or other symptoms of heart disease. During this procedure, a large X-ray machine, called a CT scanner, takes detailed pictures of the heart and the surrounding area after a dye (contrast material) has been injected into blood vessels in the area. The procedure is also sometimes called a coronary CT angiogram, coronary artery scanning, or CTA. A cardiac CT angiogram allows the health care provider to see how well blood is flowing to and from the heart. The health care provider will be able to see if there are any problems, such as:  Blockage or narrowing of the coronary arteries in the heart.  Fluid around the heart.  Signs of weakness or disease in the muscles, valves, and tissues of the heart. Tell a health care provider about:  Any allergies you have. This is especially important if you have had a previous allergic reaction to contrast dye.  All medicines you are taking, including vitamins, herbs, eye drops, creams, and over-the-counter medicines.  Any blood  disorders you have.  Any surgeries you have had.  Any medical conditions you have.  Whether you are pregnant or may be pregnant.  Any anxiety disorders, chronic pain, or other conditions you have that may increase your stress or prevent you from lying still. What are the risks? Generally, this is a safe procedure. However, problems may occur, including:  Bleeding.  Infection.  Allergic reactions to medicines or dyes.  Damage to other structures or organs.  Kidney damage from the dye or contrast that is used.  Increased risk of cancer from radiation exposure. This risk is low. Talk with your health care provider about: ? The risks and benefits of testing. ? How you can receive the lowest dose of radiation. What happens before the  procedure?  Wear comfortable clothing and remove any jewelry, glasses, dentures, and hearing aids.  Follow instructions from your health care provider about eating and drinking. This may include: ? For 12 hours before the test - avoid caffeine. This includes tea, coffee, soda, energy drinks, and diet pills. Drink plenty of water or other fluids that do not have caffeine in them. Being well-hydrated can prevent complications. ? For 4-6 hours before the test - stop eating and drinking. The contrast dye can cause nausea, but this is less likely if your stomach is empty.  Ask your health care provider about changing or stopping your regular medicines. This is especially important if you are taking diabetes medicines, blood thinners, or medicines to treat erectile dysfunction. What happens during the procedure?  Hair on your chest may need to be removed so that small sticky patches called electrodes can be placed on your chest. These will transmit information that helps to monitor your heart during the test.  An IV tube will be inserted into one of your veins.  You might be given a medicine to control your heart rate during the test. This will help to  ensure that good images are obtained.  You will be asked to lie on an exam table. This table will slide in and out of the CT machine during the procedure.  Contrast dye will be injected into the IV tube. You might feel warm, or you may get a metallic taste in your mouth.  You will be given a medicine (nitroglycerin) to relax (dilate) the arteries in your heart.  The table that you are lying on will move into the CT machine tunnel for the scan.  The person running the machine will give you instructions while the scans are being done. You may be asked to: ? Keep your arms above your head. ? Hold your breath. ? Stay very still, even if the table is moving.  When the scanning is complete, you will be moved out of the machine.  The IV tube will be removed. The procedure may vary among health care providers and hospitals. What happens after the procedure?  You might feel warm, or you may get a metallic taste in your mouth from the contrast dye.  You may have a headache from the nitroglycerin.  After the procedure, drink water or other fluids to wash (flush) the contrast material out of your body.  Contact a health care provider if you have any symptoms of allergy to the contrast. These symptoms include: ? Shortness of breath. ? Rash or hives. ? A racing heartbeat.  Most people can return to their normal activities right after the procedure. Ask your health care provider what activities are safe for you.  It is up to you to get the results of your procedure. Ask your health care provider, or the department that is doing the procedure, when your results will be ready. Summary  A cardiac CT angiogram is a procedure to look at the heart and the area around the heart. It may be done to help find the cause of chest pains or other symptoms of heart disease.  During this procedure, a large X-ray machine, called a CT scanner, takes detailed pictures of the heart and the surrounding area after  a dye (contrast material) has been injected into blood vessels in the area.  Ask your health care provider about changing or stopping your regular medicines before the procedure. This is especially important if you are taking  diabetes medicines, blood thinners, or medicines to treat erectile dysfunction.  After the procedure, drink water or other fluids to wash (flush) the contrast material out of your body. This information is not intended to replace advice given to you by your health care provider. Make sure you discuss any questions you have with your health care provider. Document Released: 04/12/2008 Document Revised: 04/12/2017 Document Reviewed: 03/19/2016 Elsevier Patient Education  [place].  Echocardiogram An echocardiogram is a procedure that uses painless sound waves (ultrasound) to produce an image of the heart. Images from an echocardiogram can provide important information about:  Signs of coronary artery disease (CAD).  Aneurysm detection. An aneurysm is a weak or damaged part of an artery wall that bulges out from the normal force of blood pumping through the body.  Heart size and shape. Changes in the size or shape of the heart can be associated with certain conditions, including heart failure, aneurysm, and CAD.  Heart muscle function.  Heart valve function.  Signs of a past heart attack.  Fluid buildup around the heart.  Thickening of the heart muscle.  A tumor or infectious growth around the heart valves. Tell a health care provider about:  Any allergies you have.  All medicines you are taking, including vitamins, herbs, eye drops, creams, and over-the-counter medicines.  Any blood disorders you have.  Any surgeries you have had.  Any medical conditions you have.  Whether you are pregnant or may be pregnant. What are the risks? Generally, this is a safe procedure. However, problems may occur, including:  Allergic reaction to dye (contrast)  that may be used during the procedure. What happens before the procedure? No specific preparation is needed. You may eat and drink normally. What happens during the procedure?   An IV tube may be inserted into one of your veins.  You may receive contrast through this tube. A contrast is an injection that improves the quality of the pictures from your heart.  A gel will be applied to your chest.  A wand-like tool (transducer) will be moved over your chest. The gel will help to transmit the sound waves from the transducer.  The sound waves will harmlessly bounce off of your heart to allow the heart images to be captured in real-time motion. The images will be recorded on a computer. The procedure may vary among health care providers and hospitals. What happens after the procedure?  You may return to your normal, everyday life, including diet, activities, and medicines, unless your health care provider tells you not to do that. Summary  An echocardiogram is a procedure that uses painless sound waves (ultrasound) to produce an image of the heart.  Images from an echocardiogram can provide important information about the size and shape of your heart, heart muscle function, heart valve function, and fluid buildup around your heart.  You do not need to do anything to prepare before this procedure. You may eat and drink normally.  After the echocardiogram is completed, you may return to your normal, everyday life, unless your health care provider tells you not to do that. This information is not intended to replace advice given to you by your health care provider. Make sure you discuss any questions you have with your health care provider. Document Released: 04/27/2000 Document Revised: 08/21/2018 Document Reviewed: 06/02/2016 Elsevier Patient Education  2020 [employer].      Adopting a Healthy Lifestyle.  Know what a healthy weight is for you (roughly BMI <  25) and aim to maintain  this   Aim for 7+ servings of fruits and vegetables daily   65-80+ fluid ounces of water or unsweet tea for healthy kidneys   Limit to max 1 drink of alcohol per day; avoid smoking/tobacco   Limit animal fats in diet for cholesterol and heart health - choose grass fed whenever available   Avoid highly processed foods, and foods high in saturated/trans fats   Aim for low stress - take time to unwind and care for your mental health   Aim for 150 min of moderate intensity exercise weekly for heart health, and weights twice weekly for bone health   Aim for 7-9 hours of sleep daily   When it comes to diets, agreement about the perfect plan isnt easy to find, even among the experts. Experts at the [employer] developed an idea known as the Healthy Eating Plate. Just imagine a plate divided into logical, healthy portions.   The emphasis is on diet quality:   Load up on vegetables and fruits - one-half of your plate: Aim for color and variety, and remember that potatoes dont count.   Go for whole grains - one-quarter of your plate: Whole wheat, barley, wheat berries, quinoa, oats, brown rice, and foods made with them. If you want pasta, go with whole wheat pasta.   Protein power - one-quarter of your plate: Fish, chicken, beans, and nuts are all healthy, versatile protein sources. Limit red meat.   The diet, however, does go beyond the plate, offering a few other suggestions.   Use healthy plant oils, such as olive, canola, soy, corn, sunflower and peanut. Check the labels, and avoid partially hydrogenated oil, which have unhealthy trans fats.   If youre thirsty, drink water. Coffee and tea are good in moderation, but skip sugary drinks and limit milk and dairy products to one or two daily servings.   The type of carbohydrate in the diet is more important than the amount. Some sources of carbohydrates, such as vegetables, fruits, whole grains, and beans-are healthier  than others.   Finally, stay active  Signed, Berniece Salines, DO  03/06/2019 9:16 PM    [facility]

## 2019-03-07 LAB — BASIC METABOLIC PANEL
BUN/Creatinine Ratio: 18 (ref 9–23)
BUN: 17 mg/dL (ref 6–24)
CO2: 25 mmol/L (ref 20–29)
Calcium: 9.5 mg/dL (ref 8.7–10.2)
Chloride: 98 mmol/L (ref 96–106)
Creatinine, Ser: 0.95 mg/dL (ref 0.57–1.00)
GFR calc Af Amer: 87 mL/min/{1.73_m2} (ref >59)
GFR calc non Af Amer: 75 mL/min/{1.73_m2} (ref >59)
Glucose: 86 mg/dL (ref 65–99)
Potassium: 3.9 mmol/L (ref 3.5–5.2)
Sodium: 137 mmol/L (ref 134–144)

## 2019-03-07 LAB — LIPID PANEL
Chol/HDL Ratio: 3.1 ratio (ref 0.0–4.4)
Cholesterol, Total: 289 mg/dL — ABNORMAL HIGH (ref 100–199)
HDL: 92 mg/dL (ref >39)
LDL Chol Calc (NIH): 175 mg/dL — ABNORMAL HIGH (ref 0–99)
Triglycerides: 130 mg/dL (ref 0–149)
VLDL Cholesterol Cal: 22 mg/dL (ref 5–40)

## 2019-03-07 LAB — TSH: TSH: 1.1 u[IU]/mL (ref 0.450–4.500)

## 2019-03-07 LAB — MAGNESIUM: Magnesium: 2.1 mg/dL (ref 1.6–2.3)

## 2019-03-07 LAB — PRO B NATRIURETIC PEPTIDE: NT-Pro BNP: 43 pg/mL (ref 0–130)

## 2019-03-09 ENCOUNTER — Ambulatory Visit (HOSPITAL_BASED_OUTPATIENT_CLINIC_OR_DEPARTMENT_OTHER)
Admission: RE | Admit: 2019-03-09 | Discharge: 2019-03-09 | Disposition: A | Discharge status: Home / Self Care | Payer: 59 | Source: Ambulatory Visit | Attending: Cardiology | Admitting: Cardiology

## 2019-03-09 ENCOUNTER — Telehealth: Payer: Self-pay | Admitting: *Deleted

## 2019-03-09 DIAGNOSIS — R0600 Dyspnea, unspecified: Secondary | ICD-10-CM | POA: Diagnosis not present

## 2019-03-09 MED ORDER — ATORVASTATIN CALCIUM 20 MG PO TABS: 20.0000 mg | ORAL_TABLET | Freq: Every day | ORAL | 1 refills | Status: DC

## 2019-03-09 NOTE — Progress Notes (Signed)
  Echocardiogram 2D Echocardiogram has been performed.  Claire Sharp 03/09/2019, 1:48 PM

## 2019-03-09 NOTE — Telephone Encounter (Signed)
-----   Message from Berniece Salines, DO sent at 03/07/2019 12:14 PM EDT ----- Mickel Baas please notify patient her chemistry was normal.  However, her LDL is 175 and her total cholesterol is 289. I would like her to start a statin: Lipitor 20 mg daily the patient is agreeable

## 2019-03-09 NOTE — Telephone Encounter (Signed)
Telephone call to patient. Informed of lab results and need to start Lipitor 20 mg daily. Pt is agreeable and asked that it be called to [facility] [address]

## 2019-03-11 ENCOUNTER — Telehealth: Payer: Self-pay | Admitting: Cardiology

## 2019-03-11 NOTE — Telephone Encounter (Signed)
Telephone call to patient. Informed of echo results. 

## 2019-03-11 NOTE — Telephone Encounter (Signed)
Wants echo results °

## 2019-03-20 ENCOUNTER — Telehealth: Payer: Self-pay | Admitting: Cardiology

## 2019-03-20 NOTE — Telephone Encounter (Signed)
wannts to know if we got her BP readings and wants her monitor results

## 2019-03-23 NOTE — Telephone Encounter (Signed)
Telephone call to patient. Informed monitor results are still in transit to company. Asked to bring BP back because we do not have them.

## 2019-03-30 ENCOUNTER — Telehealth (HOSPITAL_COMMUNITY): Payer: Self-pay | Admitting: Emergency Medicine

## 2019-03-30 NOTE — Telephone Encounter (Signed)
Reaching out to patient to offer assistance regarding upcoming cardiac imaging study; pt verbalizes understanding of appt date/time, parking situation and where to check in, pre-test NPO status and medications ordered, and verified current allergies; name and call back number provided for further questions should they arise Shavaun Osterloh RN Navigator Cardiac Imaging [facility] [phone] office [phone] cell 

## 2019-03-31 ENCOUNTER — Telehealth: Payer: Self-pay | Admitting: *Deleted

## 2019-03-31 ENCOUNTER — Other Ambulatory Visit: Payer: Self-pay

## 2019-03-31 ENCOUNTER — Ambulatory Visit (HOSPITAL_COMMUNITY)
Admission: RE | Admit: 2019-03-31 | Discharge: 2019-03-31 | Disposition: A | Discharge status: Home / Self Care | Payer: 59 | Source: Ambulatory Visit | Attending: Cardiology | Admitting: Cardiology

## 2019-03-31 DIAGNOSIS — R079 Chest pain, unspecified: Secondary | ICD-10-CM | POA: Diagnosis present

## 2019-03-31 MED ORDER — NITROGLYCERIN 0.4 MG SL SUBL
0.8000 mg | SUBLINGUAL_TABLET | Freq: Once | SUBLINGUAL | Status: AC
  Administered 2019-03-31: 0.8 mg via SUBLINGUAL

## 2019-03-31 MED ORDER — HYDRALAZINE HCL 25 MG PO TABS: 25.0000 mg | ORAL_TABLET | Freq: Three times a day (TID) | ORAL | 1 refills | Status: DC

## 2019-03-31 MED ORDER — IOHEXOL 350 MG/ML SOLN
100.0000 mL | Freq: Once | INTRAVENOUS | Status: AC | PRN
  Administered 2019-03-31: 100 mL via INTRAVENOUS

## 2019-03-31 MED ORDER — NITROGLYCERIN 0.4 MG SL SUBL
SUBLINGUAL_TABLET | SUBLINGUAL | Status: AC
  Filled 2019-03-31: qty 2

## 2019-03-31 NOTE — Telephone Encounter (Signed)
Telephone call to patient. Informed patient that Dr Harriet Masson reviewed her blood pressures she dropped off and wants to start her on hydralazine 25 mg evry 8 hours. Prescription sent in to [facility] [place] [address]. Patient verbalized understanding and had no further questions

## 2019-03-31 NOTE — Progress Notes (Signed)
CT scan completed. Tolerated well. D/C home walking. Awake and alert. In no distress. 

## 2019-04-01 ENCOUNTER — Telehealth: Payer: Self-pay | Admitting: Cardiology

## 2019-04-01 NOTE — Telephone Encounter (Signed)
Pt. Calling to get her results from her CT.

## 2019-04-01 NOTE — Telephone Encounter (Signed)
Telephone call to patient. Informed her that the results of yesterdays cTa has not resulted yet. Will call as soon as Dr Harriet Masson reviews them.

## 2019-04-16 ENCOUNTER — Telehealth: Payer: Self-pay | Admitting: Cardiology

## 2019-04-16 NOTE — Telephone Encounter (Signed)
Wants monitor results °

## 2019-04-17 NOTE — Telephone Encounter (Signed)
Telephone call to patient. Given monitor results. Not having any problems at the moment and has follow up appointment 05/11/19. Will call if symptoms come back.

## 2019-04-29 ENCOUNTER — Ambulatory Visit (INDEPENDENT_AMBULATORY_CARE_PROVIDER_SITE_OTHER): Payer: 59 | Admitting: Cardiology

## 2019-04-29 ENCOUNTER — Encounter: Payer: Self-pay | Admitting: Cardiology

## 2019-04-29 ENCOUNTER — Other Ambulatory Visit: Payer: Self-pay

## 2019-04-29 VITALS — BP 142/80 | HR 84 | Ht 64.0 in | Wt 198.0 lb

## 2019-04-29 DIAGNOSIS — I493 Ventricular premature depolarization: Secondary | ICD-10-CM

## 2019-04-29 DIAGNOSIS — I10 Essential (primary) hypertension: Secondary | ICD-10-CM | POA: Diagnosis not present

## 2019-04-29 DIAGNOSIS — E782 Mixed hyperlipidemia: Secondary | ICD-10-CM | POA: Diagnosis not present

## 2019-04-29 MED ORDER — DILTIAZEM HCL ER COATED BEADS 240 MG PO CP24: 240.0000 mg | ORAL_CAPSULE | Freq: Every day | ORAL | 1 refills | Status: DC

## 2019-04-29 NOTE — Patient Instructions (Signed)
Medication Instructions:  Your physician has recommended you make the following change in your medication:   STOP: Nifedipine STOP: Hydralazine  START: Diltiazem CD 240 mg (cardizem CD) Take 1 tab daily  *If you need a refill on your cardiac medications before your next appointment, please call your pharmacy*  Lab Work: None If you have labs (blood work) drawn today and your tests are completely normal, you will receive your results only by: Marland Kitchen MyChart Message (if you have MyChart) OR . A paper copy in the mail If you have any lab test that is abnormal or we need to change your treatment, we will call you to review the results.  Testing/Procedures: None  Follow-Up: At [facility], you and your health needs are our priority.  As part of our continuing mission to provide you with exceptional heart care, we have created designated Provider Care Teams.  These Care Teams include your primary Cardiologist (physician) and Advanced Practice Providers (APPs -  Physician Assistants and Nurse Practitioners) who all work together to provide you with the care you need, when you need it.  Your next appointment:   1 month(s)  The format for your next appointment:   In Person  Provider:   Berniece Salines, DO  Other Instructions Diltiazem extended-release capsules or tablets What is this medicine? DILTIAZEM (dil TYE a zem) is a calcium-channel blocker. It affects the amount of calcium found in your heart and muscle cells. This relaxes your blood vessels, which can reduce the amount of work the heart has to do. This medicine is used to treat high blood pressure and chest pain caused by angina. This medicine may be used for other purposes; ask your health care provider or pharmacist if you have questions. COMMON BRAND NAME(S): Cardizem CD, Cardizem LA, Cardizem SR, Cartia XT, Dilacor XR, Dilt-CD, Diltia XT, Diltzac, Matzim LA, Rema Fendt, TIADYLT ER, Tiamate, Tiazac What should I tell my health care  provider before I take this medicine? They need to know if you have any of these conditions:  heart problems, low blood pressure, irregular heartbeat  liver disease  previous heart attack  an unusual or allergic reaction to diltiazem, other medicines, foods, dyes, or preservatives  pregnant or trying to get pregnant  breast-feeding How should I use this medicine? Take this medicine by mouth with a glass of water. Follow the directions on the prescription label. Swallow whole, do not crush or chew. Ask your doctor or pharmacist if your should take this medicine with food. Take your doses at regular intervals. Do not take your medicine more often then directed. Do not stop taking except on the advice of your doctor or health care professional. Ask your doctor or health care professional how to gradually reduce the dose. Talk to your pediatrician regarding the use of this medicine in children. Special care may be needed. Overdosage: If you think you have taken too much of this medicine contact a poison control center or emergency room at once. NOTE: This medicine is only for you. Do not share this medicine with others. What if I miss a dose? If you miss a dose, take it as soon as you can. If it is almost time for your next dose, take only that dose. Do not take double or extra doses. What may interact with this medicine? Do not take this medicine with any of the following medications:  cisapride  hawthorn  pimozide  ranolazine  red yeast rice This medicine may also interact with  the following medications:  buspirone  carbamazepine  cimetidine  cyclosporine  digoxin  local anesthetics or general anesthetics  lovastatin  medicines for anxiety or difficulty sleeping like midazolam and triazolam  medicines for high blood pressure or heart problems  quinidine  rifampin, rifabutin, or rifapentine This list may not describe all possible interactions. Give your health  care provider a list of all the medicines, herbs, non-prescription drugs, or dietary supplements you use. Also tell them if you smoke, drink alcohol, or use illegal drugs. Some items may interact with your medicine. What should I watch for while using this medicine? Check your blood pressure and pulse rate regularly. Ask your doctor or health care professional what your blood pressure and pulse rate should be and when you should contact him or her. You may feel dizzy or lightheaded. Do not drive, use machinery, or do anything that needs mental alertness until you know how this medicine affects you. To reduce the risk of dizzy or fainting spells, do not sit or stand up quickly, especially if you are an older patient. Alcohol can make you more dizzy or increase flushing and rapid heartbeats. Avoid alcoholic drinks. What side effects may I notice from receiving this medicine? Side effects that you should report to your doctor or health care professional as soon as possible:  allergic reactions like skin rash, itching or hives, swelling of the face, lips, or tongue  confusion, mental depression  feeling faint or lightheaded, falls  redness, blistering, peeling or loosening of the skin, including inside the mouth  slow, irregular heartbeat  swelling of the feet and ankles  unusual bleeding or bruising, pinpoint red spots on the skin Side effects that usually do not require medical attention (report to your doctor or health care professional if they continue or are bothersome):  constipation or diarrhea  difficulty sleeping  facial flushing  headache  nausea, vomiting  sexual dysfunction  weak or tired This list may not describe all possible side effects. Call your doctor for medical advice about side effects. You may report side effects to FDA at 1-800-FDA-1088. Where should I keep my medicine? Keep out of the reach of children. Store at room temperature between 15 and 30 degrees C  (59 and 86 degrees F). Protect from humidity. Throw away any unused medicine after the expiration date. NOTE: This sheet is a summary. It may not cover all possible information. If you have questions about this medicine, talk to your doctor, pharmacist, or health care provider.  2020 Elsevier/Gold Standard (2007-08-21 14:35:47)

## 2019-04-29 NOTE — Progress Notes (Signed)
Cardiology Office Note:    Date:  04/29/2019   ID:  Claire Sharp, DOB [birth year], MRN [id number]  PCP:  Raina Mina., MD  Cardiologist:  Berniece Salines, DO  Electrophysiologist:  None   Referring MD: Raina Mina., MD   Follow up   History of Present Illness:    Claire Sharp is a 38 y.o. female with a hx of hypertension, postpartum cardiomyopathy obesity: Obstructive sleep apnea, mitral valve prolapse presents to be evaluated for intermittent chest pain palpitations and with worsening shortness of breath on October 23,2020.  At the conclusion of visit I ordered a CTA coronaries, transthoracic echocardiogram along with ZIO monitor.  In addition  Today the patient presents to discuss her results.  She reports that the palpitations have still persisted although the chest pain has improved.  She also tells me that she has been experiencing significant headaches with her hydralazine.  Past Medical History:  Diagnosis Date  . Abnormal uterine bleeding 03/06/2019  . Anxiety disorder 07/24/2017   Alprazolam 15 pills. for 3 months or more.  . Chronic left-sided low back pain with sciatica 12/05/2017  . Class 1 obesity due to excess calories without serious comorbidity with body mass index (BMI) of 31.0 to 31.9 in adult 07/24/2017  . Degenerative lumbar disc 12/06/2017  . Depression 04/04/2018   -History of depression for which she prior was on Wellbutrin and Xanax PRN -Wellbutrin was started to help her with both depression as well as smoking cessation but she stopped this a while back -Stopped PRN xanax once she conceived -EPDS 1 today -Offered referral to perinatal psych for ongoing management but she declines.  Last Assessment & Plan:  Doing well today, mood reported as good.  . Encounter for procreative genetic counseling 05/02/2018   Genetic counseling visit on 05/09/18  Aneuploidy screening/ testing:  cfDNA ([employer] Non-invasive Prenatal Screening); results negative: no aneuploidy detected.  Fetal fraction 10%.  Carrier screening:  [x]  Cystic fibrosis - negative x 60 mutations [x]  Spinal muscular atrophy - analysis failed. Notified patient. Declines redraw. [x]  Hemoglobinopathy screening - normal adult hemoglobin  [x]  Karyot  . Encounter for supervision of elderly multigravida in first trimester, antepartum 04/04/2018   Dating: L/7  MOD: RCS at 39 weeks [ ]  schedule Baby name: [ ]    FWB/Screening:  [-] Carrier screening: CF/SMA/Hgb EP - discussed 11/21, declined > saw GC and opted for testing. CF/Hgb electrophoresis normal. SMA was unable to be completed (unclear reason; sample failed post amplification quality requirements). [-] Patient notified by [facility] and declines redraw [x]  Aneuploidy screening - NIPT neg [x]  A  . Essential hypertension 12/09/2018  . Galactorrhea not associated with childbirth 03/06/2019  . Glucose intolerance of pregnancy 08/11/2018   3/30: 1 hr GTT 162 > [x]  3 hr GTT - 102/189/126/104. Fasting draw, however, was obtained 10/15 after administration of glucose and thus likely abnormally elevated. Would say patient passed test with only one abnormal level. Message out to nursing staff to notify patient, roffer nutrition, 08/19/18 [ ]   . History of MTHFR mutation 04/04/2018   -Identified during work-up for RPL at [facility].  -Already on PNV w/ folic acid supplementation.  -No need for further intervention  Last Assessment & Plan:  -Identified during work-up for RPL at [facility].  -Already on PNV w/ folic acid supplementation.  -No need for further intervention  . Lentigo 12/05/2017  . Localized swelling, mass, or lump of lower extremity, bilateral 10/27/2018   Postpartum admission for orthopnea and LE swelling,  BNP 990. Echo normal, no signs of heart failure. Patient remained HDS through hospital stay and discharge after diuresis and improved symptoms on 10/27/2018  . Malaise and fatigue 07/24/2017  . MVP (mitral valve prolapse) 07/24/2017  . Obstructive sleep apnea syndrome  07/24/2017   HST 09/02/17 AHI 5.7  . Other idiopathic scoliosis, lumbar region 12/06/2017  . Pain in pelvis 03/06/2019  . Pregnancy-induced hypertension with pulmonary edema 11/01/2018  . Primary osteoarthritis of both hips 12/06/2017   Seen on xrays 11/2017  . 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 Ruptured cyst of ovary 03/06/2019  . Tachycardia 07/24/2017  . Thyroid nodule 10/05/2016   Reported okay.  Would like updated  . Urinary tract infectious disease 03/06/2019  . Vitamin D deficiency 10/05/2016    Past Surgical History:  Procedure Laterality Date  . CESAREAN SECTION     05/24/06 and 12/10/02  . DILATION AND EVACUATION N/A 12/08/2014   Procedure: DILATATION AND EVACUATION;  Surgeon: Vanessa Kick, MD;  Location: [facility];  Service: Gynecology;  Laterality: N/A;  . PARATHYROIDECTOMY      Current Medications: Current Meds  Medication Sig  . ALPRAZolam (XANAX) 0.5 MG tablet Take 0.5 mg by mouth at bedtime as needed.  Marland Kitchen atorvastatin (LIPITOR) 20 MG tablet Take 1 tablet (20 mg total) by mouth daily.  . furosemide (LASIX) 20 MG tablet Take 40 mg (2 tabs) in the AM and 20 mg (1 tab) in the PM  . ibuprofen (ADVIL,MOTRIN) 600 MG tablet Take 1 tablet (600 mg total) by mouth every 6 (six) hours as needed.  . [DISCONTINUED] hydrALAZINE (APRESOLINE) 25 MG tablet Take 1 tablet (25 mg total) by mouth 3 (three) times daily.  . [DISCONTINUED] NIFEdipine (PROCARDIA-XL/NIFEDICAL-XL) 30 MG 24 hr tablet Take 30 mg by mouth daily.     Allergies:   Patient has no  known allergies.   Social History   Socioeconomic History  . Marital status: Married    Spouse name: Not on file  . Number of children: Not on file  . Years of education: Not on file  . Highest education level: Not on file  Occupational History  . Not on file  Tobacco Use  . Smoking status: Former Smoker    Quit date: 03/21/2018    Years since quitting: 1.1  . Smokeless tobacco: Never Used  Substance and Sexual Activity  . Alcohol use: Not Currently  . Drug use: Never  . Sexual activity: Not on file  Other Topics Concern  . Not on file  Social History Narrative  . Not on file   Social Determinants of Health   Financial Resource Strain:   . Difficulty of Paying Living Expenses: Not on file  Food Insecurity:   . Worried About Charity fundraiser in the Last Year: Not on file  . Ran Out of Food in the Last Year: Not on file  Transportation Needs:   . Lack of Transportation (Medical): Not on file  . Lack  of Transportation (Non-Medical): Not on file  Physical Activity:   . Days of Exercise per Week: Not on file  . Minutes of Exercise per Session: Not on file  Stress:   . Feeling of Stress : Not on file  Social Connections:   . Frequency of Communication with Friends and Family: Not on file  . Frequency of Social Gatherings with Friends and Family: Not on file  . Attends Religious Services: Not on file  . Active Member of Clubs or Organizations: Not on file  . Attends Archivist Meetings: Not on file  . Marital Status: Not on file     Family History: The patient's family history includes Aneurysm in her maternal grandfather; Coronary artery disease in her father and mother; Heart attack in her maternal grandfather; Hyperlipidemia in her father; Hypertension in her father and mother; Liver disease in her mother; Rheum arthritis in her sister; Scoliosis in her sister.  ROS:   Review of Systems  Constitution: Negative for decreased appetite, fever and weight  gain.  HENT: Negative for congestion, ear discharge, hoarse voice and sore throat.   Eyes: Negative for discharge, redness, vision loss in right eye and visual halos.  Cardiovascular: Negative for chest pain, dyspnea on exertion, leg swelling, orthopnea and palpitations.  Respiratory: Negative for cough, hemoptysis, shortness of breath and snoring.   Endocrine: Negative for heat intolerance and polyphagia.  Hematologic/Lymphatic: Negative for bleeding problem. Does not bruise/bleed easily.  Skin: Negative for flushing, nail changes, rash and suspicious lesions.  Musculoskeletal: Negative for arthritis, joint pain, muscle cramps, myalgias, neck pain and stiffness.  Gastrointestinal: Negative for abdominal pain, bowel incontinence, diarrhea and excessive appetite.  Genitourinary: Negative for decreased libido, genital sores and incomplete emptying.  Neurological: Negative for brief paralysis, focal weakness, headaches and loss of balance.  Psychiatric/Behavioral: Negative for altered mental status, depression and suicidal ideas.  Allergic/Immunologic: Negative for HIV exposure and persistent infections.    EKGs/Labs/Other Studies Reviewed:    The following studies were reviewed today:   EKG: None today  CTA coronary FINDINGS: Non-cardiac: See separate report from [facility]. Pulmonary veins drained normally to the left atrium. Calcium Score: 0 Agatston units. Coronary Arteries: Right dominant with no anomalies LM: No plaque or stenosis. LAD system:  No plaque or stenosis. Circumflex system: Small to moderate ramus. No plaque or stenosis in the LCx system. RCA system: No plaque or stenosis. IMPRESSION: 1. Coronary artery calcium score 0 Agatston units, suggesting low risk for future cardiac events. 2.  No significant coronary disease noted  Transthoracic echocardiogram IMPRESSIONS  1. Left ventricular ejection fraction, by visual estimation, is 60 to 65%. The left  ventricle has normal function. Normal left ventricular size. There is no left ventricular hypertrophy.  FINDINGS  Left Ventricle: Left ventricular ejection fraction, by visual estimation, is 60 to 65%. The left ventricle has normal function. There is no left ventricular hypertrophy. Normal left ventricular size.  Right Ventricle: The right ventricular size is normal. No increase in right ventricular wall thickness. Global RV systolic function is has normal systolic function. The tricuspid regurgitant velocity is 2.20 m/s, and with an assumed right atrial pressure  of 3 mmHg, the estimated right ventricular systolic pressure is normal at 22.4 mmHg.  Left Atrium: Left atrial size was normal in size.  Right Atrium: Right atrial size was normal in size  Pericardium: There is no evidence of pericardial effusion.  Mitral Valve: The mitral valve is normal in structure. No evidence of mitral  valve stenosis by observation. No evidence of mitral valve regurgitation.  Tricuspid Valve: The tricuspid valve is normal in structure. Tricuspid valve regurgitation is trivial by color flow Doppler.  Aortic Valve: The aortic valve is normal in structure. Aortic valve regurgitation was not visualized by color flow Doppler. The aortic valve is structurally normal, with no evidence of sclerosis or stenosis.  Pulmonic Valve: The pulmonic valve was normal in structure. Pulmonic valve regurgitation is not visualized by color flow Doppler.  Aorta: The aortic root, ascending aorta and aortic arch are all structurally normal, with no evidence of dilitation or obstruction.  Venous: The inferior vena cava is normal in size with greater than 50% respiratory variability, suggesting right atrial pressure of 3 mmHg.  IAS/Shunts: No atrial level shunt detected by color flow Doppler. No ventricular septal defect is seen or detected. There is no evidence of an atrial septal defect.  ZIO monitor indication:  Palpitations The minimal heart rate was 49 bpm, the maximum heart rate was 190 bpm, the average heart rate was 83 bpm.   The predominant underlying rhythm was sinus.  Slow P wave morphology change was noted.  Idioventricular rhythm was also present.   There was 1 run of supraventricular tachycardia lasting 11 beats with a maximum heart rate of 190 bpm ( average HR was 179 bpm).  The episodes of supraventricular tachycardia was associated with a patient triggered event. Premature atrial complexes were rare (<1.0%). Premature ventricular complexes were rare  (<1.0%,1822) with VE Couplets (<1.0%, 36), and VE Triplets (<1.0%, 7).  Ventricular Bigeminy and Trigeminy were present. 12 patient triggered events were noted: 7 associated with premature ventricular complex and 1 associated with the run of supraventricular tachycardia the remaining patient triggered event was associated with sinus rhythm. No pauses, no AV block, no significant tachycardia and no atrial fibrillation was present.  Conclusion: This study is remarkable for paroxysmal/symptomatic supraventricular tachycardia which is likely atrial tachycardia think symptomatic premature ventricular complexes.  Recent Labs: 03/06/2019: BUN 17; Creatinine, Ser 0.95; Magnesium 2.1; NT-Pro BNP 43; Potassium 3.9; Sodium 137; TSH 1.100  Recent Lipid Panel    Component Value Date/Time   CHOL 289 (H) 03/06/2019 0941   TRIG 130 03/06/2019 0941   HDL 92 03/06/2019 0941   CHOLHDL 3.1 03/06/2019 0941   LDLCALC 175 (H) 03/06/2019 0941    Physical Exam:    VS:  BP (!) 142/80   Pulse 84   Ht 5\' 4"  (1.626 m)   Wt 198 lb (89.8 kg)   BMI 33.99 kg/m     Wt Readings from Last 3 Encounters:  04/29/19 198 lb (89.8 kg)  03/06/19 187 lb (84.8 kg)  12/08/14 165 lb (74.8 kg)     GEN: Well nourished, well developed in no acute distress HEENT: Normal NECK: No JVD; No carotid bruits LYMPHATICS: No lymphadenopathy CARDIAC: S1S2 noted,RRR, no murmurs,  rubs, gallops RESPIRATORY:  Clear to auscultation without rales, wheezing or rhonchi  ABDOMEN: Soft, non-tender, non-distended, +bowel sounds, no guarding. EXTREMITIES: No edema, No cyanosis, no clubbing MUSCULOSKELETAL:  No edema; No deformity  SKIN: Warm and dry NEUROLOGIC:  Alert and oriented x 3, non-focal PSYCHIATRIC:  Normal affect, good insight  ASSESSMENT:    1. Symptomatic PVCs   2. Essential hypertension   3. Mixed hyperlipidemia    PLAN:    It would be best to start a medication that will help with the patient's PVCs.  She does have hypertension she is currently on nifedipine and hydralazine.  She  is experiencing headaches with the hydralazine.  Therefore at this time would like to stop nifedipine and hydralazine.  Then I will start the patient on Cardizem which will help with her blood pressure as well as with symptoms improvement with her PVCs.  We have discussed this and patient is agreeable to proceed with this plan.  In addition continue patient on her statin medication.    The patient is in agreement with the above plan. The patient left the office in stable condition.  The patient will follow up in 1 month due to new medication for blood pressure   Medication Adjustments/Labs and Tests Ordered: Current medicines are reviewed at length with the patient today.  Concerns regarding medicines are outlined above.  No orders of the defined types were placed in this encounter.  Meds ordered this encounter  Medications  . diltiazem (CARDIZEM CD) 240 MG 24 hr capsule    Sig: Take 1 capsule (240 mg total) by mouth daily.    Dispense:  90 capsule    Refill:  1    Patient Instructions  Medication Instructions:  Your physician has recommended you make the following change in your medication:   STOP: Nifedipine STOP: Hydralazine  START: Diltiazem CD 240 mg (cardizem CD) Take 1 tab daily  *If you need a refill on your cardiac medications before your next appointment,  please call your pharmacy*  Lab Work: None If you have labs (blood work) drawn today and your tests are completely normal, you will receive your results only by: Marland Kitchen MyChart Message (if you have MyChart) OR . A paper copy in the mail If you have any lab test that is abnormal or we need to change your treatment, we will call you to review the results.  Testing/Procedures: None  Follow-Up: At [facility], you and your health needs are our priority.  As part of our continuing mission to provide you with exceptional heart care, we have created designated Provider Care Teams.  These Care Teams include your primary Cardiologist (physician) and Advanced Practice Providers (APPs -  Physician Assistants and Nurse Practitioners) who all work together to provide you with the care you need, when you need it.  Your next appointment:   1 month(s)  The format for your next appointment:   In Person  Provider:   Berniece Salines, DO  Other Instructions Diltiazem extended-release capsules or tablets What is this medicine? DILTIAZEM (dil TYE a zem) is a calcium-channel blocker. It affects the amount of calcium found in your heart and muscle cells. This relaxes your blood vessels, which can reduce the amount of work the heart has to do. This medicine is used to treat high blood pressure and chest pain caused by angina. This medicine may be used for other purposes; ask your health care provider or pharmacist if you have questions. COMMON BRAND NAME(S): Cardizem CD, Cardizem LA, Cardizem SR, Cartia XT, Dilacor XR, Dilt-CD, Diltia XT, Diltzac, Matzim LA, Rema Fendt, TIADYLT ER, Tiamate, Tiazac What should I tell my health care provider before I take this medicine? They need to know if you have any of these conditions:  heart problems, low blood pressure, irregular heartbeat  liver disease  previous heart attack  an unusual or allergic reaction to diltiazem, other medicines, foods, dyes, or  preservatives  pregnant or trying to get pregnant  breast-feeding How should I use this medicine? Take this medicine by mouth with a glass of water. Follow the directions on the prescription label. Swallow  whole, do not crush or chew. Ask your doctor or pharmacist if your should take this medicine with food. Take your doses at regular intervals. Do not take your medicine more often then directed. Do not stop taking except on the advice of your doctor or health care professional. Ask your doctor or health care professional how to gradually reduce the dose. Talk to your pediatrician regarding the use of this medicine in children. Special care may be needed. Overdosage: If you think you have taken too much of this medicine contact a poison control center or emergency room at once. NOTE: This medicine is only for you. Do not share this medicine with others. What if I miss a dose? If you miss a dose, take it as soon as you can. If it is almost time for your next dose, take only that dose. Do not take double or extra doses. What may interact with this medicine? Do not take this medicine with any of the following medications:  cisapride  hawthorn  pimozide  ranolazine  red yeast rice This medicine may also interact with the following medications:  buspirone  carbamazepine  cimetidine  cyclosporine  digoxin  local anesthetics or general anesthetics  lovastatin  medicines for anxiety or difficulty sleeping like midazolam and triazolam  medicines for high blood pressure or heart problems  quinidine  rifampin, rifabutin, or rifapentine This list may not describe all possible interactions. Give your health care provider a list of all the medicines, herbs, non-prescription drugs, or dietary supplements you use. Also tell them if you smoke, drink alcohol, or use illegal drugs. Some items may interact with your medicine. What should I watch for while using this medicine? Check your  blood pressure and pulse rate regularly. Ask your doctor or health care professional what your blood pressure and pulse rate should be and when you should contact him or her. You may feel dizzy or lightheaded. Do not drive, use machinery, or do anything that needs mental alertness until you know how this medicine affects you. To reduce the risk of dizzy or fainting spells, do not sit or stand up quickly, especially if you are an older patient. Alcohol can make you more dizzy or increase flushing and rapid heartbeats. Avoid alcoholic drinks. What side effects may I notice from receiving this medicine? Side effects that you should report to your doctor or health care professional as soon as possible:  allergic reactions like skin rash, itching or hives, swelling of the face, lips, or tongue  confusion, mental depression  feeling faint or lightheaded, falls  redness, blistering, peeling or loosening of the skin, including inside the mouth  slow, irregular heartbeat  swelling of the feet and ankles  unusual bleeding or bruising, pinpoint red spots on the skin Side effects that usually do not require medical attention (report to your doctor or health care professional if they continue or are bothersome):  constipation or diarrhea  difficulty sleeping  facial flushing  headache  nausea, vomiting  sexual dysfunction  weak or tired This list may not describe all possible side effects. Call your doctor for medical advice about side effects. You may report side effects to FDA at 1-800-FDA-1088. Where should I keep my medicine? Keep out of the reach of children. Store at room temperature between 15 and 30 degrees C (59 and 86 degrees F). Protect from humidity. Throw away any unused medicine after the expiration date. NOTE: This sheet is a summary. It may not cover all possible  information. If you have questions about this medicine, talk to your doctor, pharmacist, or health care  provider.  2020 Elsevier/Gold Standard (2007-08-21 14:35:47)      Adopting a Healthy Lifestyle.  Know what a healthy weight is for you (roughly BMI <25) and aim to maintain this   Aim for 7+ servings of fruits and vegetables daily   65-80+ fluid ounces of water or unsweet tea for healthy kidneys   Limit to max 1 drink of alcohol per day; avoid smoking/tobacco   Limit animal fats in diet for cholesterol and heart health - choose grass fed whenever available   Avoid highly processed foods, and foods high in saturated/trans fats   Aim for low stress - take time to unwind and care for your mental health   Aim for 150 min of moderate intensity exercise weekly for heart health, and weights twice weekly for bone health   Aim for 7-9 hours of sleep daily   When it comes to diets, agreement about the perfect plan isnt easy to find, even among the experts. Experts at the [place] developed an idea known as the Healthy Eating Plate. Just imagine a plate divided into logical, healthy portions.   The emphasis is on diet quality:   Load up on vegetables and fruits - one-half of your plate: Aim for color and variety, and remember that potatoes dont count.   Go for whole grains - one-quarter of your plate: Whole wheat, barley, wheat berries, quinoa, oats, brown rice, and foods made with them. If you want pasta, go with whole wheat pasta.   Protein power - one-quarter of your plate: Fish, chicken, beans, and nuts are all healthy, versatile protein sources. Limit red meat.   The diet, however, does go beyond the plate, offering a few other suggestions.   Use healthy plant oils, such as olive, canola, soy, corn, sunflower and peanut. Check the labels, and avoid partially hydrogenated oil, which have unhealthy trans fats.   If youre thirsty, drink water. Coffee and tea are good in moderation, but skip sugary drinks and limit milk and dairy products to one or two daily  servings.   The type of carbohydrate in the diet is more important than the amount. Some sources of carbohydrates, such as vegetables, fruits, whole grains, and beans-are healthier than others.   Finally, stay active  Signed, Berniece Salines, DO  04/29/2019 3:02 PM    [facility]

## 2019-05-11 ENCOUNTER — Ambulatory Visit: Payer: 59 | Admitting: Cardiology

## 2019-06-01 ENCOUNTER — Ambulatory Visit: Payer: Medicaid Other | Admitting: Cardiology

## 2019-06-02 ENCOUNTER — Encounter: Payer: Self-pay | Admitting: Cardiology

## 2019-06-02 ENCOUNTER — Ambulatory Visit (INDEPENDENT_AMBULATORY_CARE_PROVIDER_SITE_OTHER): Payer: 59 | Admitting: Cardiology

## 2019-06-02 ENCOUNTER — Other Ambulatory Visit: Payer: Self-pay

## 2019-06-02 VITALS — BP 122/84 | HR 83 | Ht 64.0 in | Wt 197.0 lb

## 2019-06-02 DIAGNOSIS — I493 Ventricular premature depolarization: Secondary | ICD-10-CM

## 2019-06-02 DIAGNOSIS — I10 Essential (primary) hypertension: Secondary | ICD-10-CM | POA: Diagnosis not present

## 2019-06-02 DIAGNOSIS — E782 Mixed hyperlipidemia: Secondary | ICD-10-CM

## 2019-06-02 NOTE — Progress Notes (Signed)
Cardiology Office Note:    Date:  06/02/2019   ID:  Claire Sharp, DOB 13-Feb-1981, MRN [id number]  PCP:  Raina Mina., MD  Cardiologist:  Berniece Salines, DO  Electrophysiologist:  None   Referring MD: Raina Mina., MD   Chief Complaint  Patient presents with  . Follow-up    1 Month    History of Present Illness:    Claire Sharp is a 39 y.o. female with a hx of hypertension, postpartum cardiomyopathy, obesity, obstructive sleep apnea, presented initially on October 23 to be evaluated for chest pain, palpitations and worsening shortness of breath.  At that time a ZIO monitor, his coronary CT as well as a transthoracic echocardiogram were ordered.  At that time she was hypertensive therefore placed patient on hydralazine to help with her blood pressure.  I saw the patient on April 29, 2019 after getting her testing the patient presented to discuss her testing as well as reported that she was experiencing significant headaches with her hydralazine.  Her ZIO monitor did show evidence of symptomatic PVCs, CCTA with 0 calcium and transthoracic echocardiogram with a normal LVEF.  At that time her blood pressure was elevated but not at goal.  At the conclusion of her visit I stop the nifedipine as well as the hydralazine.  Start the patient on diltiazem 240 daily to help with her symptomatic control of her PVC as well as her hypertension.  Is here today for follow-up visit she tells me that she has been doing well with the palpitations has resolved and her blood pressure is improving.  She offers no complaints at this time.   Past Medical History:  Diagnosis Date  . Abnormal uterine bleeding 03/06/2019  . Anxiety disorder 07/24/2017   Alprazolam 15 pills. for 3 months or more.  . Chronic left-sided low back pain with sciatica 12/05/2017  . Class 1 obesity due to excess calories without serious comorbidity with body mass index (BMI) of 31.0 to 31.9 in adult 07/24/2017  . Degenerative lumbar  disc 12/06/2017  . Depression 04/04/2018   -History of depression for which she prior was on Wellbutrin and Xanax PRN -Wellbutrin was started to help her with both depression as well as smoking cessation but she stopped this a while back -Stopped PRN xanax once she conceived -EPDS 1 today -Offered referral to perinatal psych for ongoing management but she declines.  Last Assessment & Plan:  Doing well today, mood reported as good.  . Encounter for procreative genetic counseling 05/02/2018   Genetic counseling visit on 05/09/18  Aneuploidy screening/ testing:  cfDNA ([facility] Non-invasive Prenatal Screening); results negative: no aneuploidy detected. Fetal fraction 10%.  Carrier screening:  [x]  Cystic fibrosis - negative x 60 mutations [x]  Spinal muscular atrophy - analysis failed. Notified patient. Declines redraw. [x]  Hemoglobinopathy screening - normal adult hemoglobin  [x]  Karyot  . Encounter for supervision of elderly multigravida in first trimester, antepartum 04/04/2018   Dating: L/7  MOD: RCS at 39 weeks [ ]  schedule Baby name: [ ]    FWB/Screening:  [-] Carrier screening: CF/SMA/Hgb EP - discussed 11/21, declined > saw GC and opted for testing. CF/Hgb electrophoresis normal. SMA was unable to be completed (unclear reason; sample failed post amplification quality requirements). [-] Patient notified by [facility] and declines redraw [x]  Aneuploidy screening - NIPT neg [x]  A  . Essential hypertension 12/09/2018  . Galactorrhea not associated with childbirth 03/06/2019  . Glucose intolerance of pregnancy 08/11/2018   3/30: 1 hr GTT 162 > [  x] 3 hr GTT - 102/189/126/104. Fasting draw, however, was obtained 10/15 after administration of glucose and thus likely abnormally elevated. Would say patient passed test with only one abnormal level. Message out to nursing staff to notify patient, roffer nutrition, 08/19/18 [ ]   . History of MTHFR mutation 04/04/2018   -Identified during work-up for RPL at [facility].   -Already on PNV w/ folic acid supplementation.  -No need for further intervention  Last Assessment & Plan:  -Identified during work-up for RPL at [facility].  -Already on PNV w/ folic acid supplementation.  -No need for further intervention  . Lentigo 12/05/2017  . Localized swelling, mass, or lump of lower extremity, bilateral 10/27/2018   Postpartum admission for orthopnea and LE swelling, BNP 990. Echo normal, no signs of heart failure. Patient remained HDS through hospital stay and discharge after diuresis and improved symptoms on 10/27/2018  . Malaise and fatigue 07/24/2017  . MVP (mitral valve prolapse) 07/24/2017  . Obstructive sleep apnea syndrome 07/24/2017   HST 09/02/17 AHI 5.7  . Other idiopathic scoliosis, lumbar region 12/06/2017  . Pain in pelvis 03/06/2019  . Pregnancy-induced hypertension with pulmonary edema 11/01/2018  . Primary osteoarthritis of both hips 12/06/2017   Seen on xrays 11/2017  . 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 Ruptured cyst of ovary 03/06/2019  . Tachycardia 07/24/2017  . Thyroid nodule 10/05/2016   Reported okay.  Would like updated  . Urinary tract infectious disease 03/06/2019  . Vitamin D deficiency 10/05/2016    Past Surgical History:  Procedure Laterality Date  . CESAREAN SECTION     05/24/06 and 12/10/02  . DILATION AND EVACUATION N/A 12/08/2014   Procedure: DILATATION AND EVACUATION;  Surgeon: Vanessa Kick, MD;  Location: [facility];  Service: Gynecology;  Laterality: N/A;  . PARATHYROIDECTOMY      Current  Medications: Current Meds  Medication Sig  . ALPRAZolam (XANAX) 0.5 MG tablet Take 0.5 mg by mouth at bedtime as needed.  Marland Kitchen atorvastatin (LIPITOR) 20 MG tablet Take 1 tablet (20 mg total) by mouth daily.  Marland Kitchen diltiazem (CARDIZEM CD) 240 MG 24 hr capsule Take 1 capsule (240 mg total) by mouth daily.  . furosemide (LASIX) 20 MG tablet Take 40 mg (2 tabs) in the AM and 20 mg (1 tab) in the PM  . ibuprofen (ADVIL,MOTRIN) 600 MG tablet Take 1 tablet (600 mg total) by mouth every 6 (six) hours as needed.     Allergies:   Patient has no known allergies.   Social History   Socioeconomic History  . Marital status: Married    Spouse name: Not on file  . Number of children: Not on file  . Years of education: Not on file  . Highest education level: Not on file  Occupational History  . Not on file  Tobacco Use  . Smoking status: Former Smoker    Quit date: 03/21/2018    Years since quitting: 1.2  . Smokeless tobacco: Never Used  Substance  and Sexual Activity  . Alcohol use: Not Currently  . Drug use: Never  . Sexual activity: Not on file  Other Topics Concern  . Not on file  Social History Narrative  . Not on file   Social Determinants of Health   Financial Resource Strain:   . Difficulty of Paying Living Expenses: Not on file  Food Insecurity:   . Worried About Charity fundraiser in the Last Year: Not on file  . Ran Out of Food in the Last Year: Not on file  Transportation Needs:   . Lack of Transportation (Medical): Not on file  . Lack of Transportation (Non-Medical): Not on file  Physical Activity:   . Days of Exercise per Week: Not on file  . Minutes of Exercise per Session: Not on file  Stress:   . Feeling of Stress : Not on file  Social Connections:   . Frequency of Communication with Friends and Family: Not on file  . Frequency of Social Gatherings with Friends and Family: Not on file  . Attends Religious Services: Not on file  . Active Member of Clubs or Organizations:  Not on file  . Attends Archivist Meetings: Not on file  . Marital Status: Not on file     Family History: The patient's family history includes Aneurysm in her maternal grandfather; Coronary artery disease in her father and mother; Heart attack in her maternal grandfather; Hyperlipidemia in her father; Hypertension in her father and mother; Liver disease in her mother; Rheum arthritis in her sister; Scoliosis in her sister.  ROS:   Review of Systems  Constitution: Negative for decreased appetite, fever and weight gain.  HENT: Negative for congestion, ear discharge, hoarse voice and sore throat.   Eyes: Negative for discharge, redness, vision loss in right eye and visual halos.  Cardiovascular: Negative for chest pain, dyspnea on exertion, leg swelling, orthopnea and palpitations.  Respiratory: Negative for cough, hemoptysis, shortness of breath and snoring.   Endocrine: Negative for heat intolerance and polyphagia.  Hematologic/Lymphatic: Negative for bleeding problem. Does not bruise/bleed easily.  Skin: Negative for flushing, nail changes, rash and suspicious lesions.  Musculoskeletal: Negative for arthritis, joint pain, muscle cramps, myalgias, neck pain and stiffness.  Gastrointestinal: Negative for abdominal pain, bowel incontinence, diarrhea and excessive appetite.  Genitourinary: Negative for decreased libido, genital sores and incomplete emptying.  Neurological: Negative for brief paralysis, focal weakness, headaches and loss of balance.  Psychiatric/Behavioral: Negative for altered mental status, depression and suicidal ideas.  Allergic/Immunologic: Negative for HIV exposure and persistent infections.    EKGs/Labs/Other Studies Reviewed:    The following studies were reviewed today:   EKG: None today   CTA coronary FINDINGS: Non-cardiac: See separate report from [facility]. Pulmonary veins drained normally to the left atrium. Calcium Score: 0 Agatston  units. Coronary Arteries: Right dominant with no anomalies LM: No plaque or stenosis. LAD system: No plaque or stenosis. Circumflex system: Small to moderate ramus. No plaque or stenosis in the LCx system. RCA system: No plaque or stenosis. IMPRESSION: 1. Coronary artery calcium score 0 Agatston units, suggesting low risk for future cardiac events. 2. No significant coronary disease noted  Transthoracic echocardiogram IMPRESSIONS 1. Left ventricular ejection fraction, by visual estimation, is 60 to 65%. The left ventricle has normal function. Normal left ventricular size. There is no left ventricular hypertrophy.  FINDINGS Left Ventricle: Left ventricular ejection fraction, by visual estimation, is 60 to 65%. The left ventricle has normal function. There  is no left ventricular hypertrophy. Normal left ventricular size.  Right Ventricle: The right ventricular size is normal. No increase in right ventricular wall thickness. Global RV systolic function is has normal systolic function. The tricuspid regurgitant velocity is 2.20 m/s, and with an assumed right atrial pressure of 3 mmHg, the estimated right ventricular systolic pressure is normal at 22.4 mmHg.  Left Atrium: Left atrial size was normal in size.  Right Atrium: Right atrial size was normal in size  Pericardium: There is no evidence of pericardial effusion.  Mitral Valve: The mitral valve is normal in structure. No evidence of mitral valve stenosis by observation. No evidence of mitral valve regurgitation.  Tricuspid Valve: The tricuspid valve is normal in structure. Tricuspid valve regurgitation is trivial by color flow Doppler.  Aortic Valve: The aortic valve is normal in structure. Aortic valve regurgitation was not visualized by color flow Doppler. The aortic valve is structurally normal, with no evidence of sclerosis or stenosis.  Pulmonic Valve: The pulmonic valve was normal in structure. Pulmonic valve  regurgitation is not visualized by color flow Doppler.  Aorta: The aortic root, ascending aorta and aortic arch are all structurally normal, with no evidence of dilitation or obstruction.  Venous: The inferior vena cava is normal in size with greater than 50% respiratory variability, suggesting right atrial pressure of 3 mmHg.  IAS/Shunts: No atrial level shunt detected by color flow Doppler. No ventricular septal defect is seen or detected. There is no evidence of an atrial septal defect.  ZIO monitor indication: Palpitations The minimal heart rate was 49 bpm, the maximum heart rate was 190 bpm, the average heart rate was 83 bpm.  The predominant underlying rhythm was sinus. Slow P wave morphology change was noted. Idioventricular rhythm was also present.  There was 1 run of supraventricular tachycardia lasting 11 beats with a maximum heart rate of 190 bpm ( average HR was 179 bpm). The episodes of supraventricular tachycardia was associated with a patient triggered event. Premature atrial complexes were rare (<1.0%). Premature ventricular complexes were rare (<1.0%,1822) with VE Couplets (<1.0%, 36), and VE Triplets (<1.0%, 7).  Ventricular Bigeminy and Trigeminy were present. 12 patient triggered events were noted: 7 associated with premature ventricular complex and 1 associated with the run of supraventricular tachycardia the remaining patient triggered event was associated with sinus rhythm. No pauses, no AV block, no significant tachycardia and no atrial fibrillation was present.  Conclusion: This study is remarkable for paroxysmal/symptomatic supraventricular tachycardia which is likely atrial tachycardia think symptomatic premature ventricular complexes.  Recent Labs: 03/06/2019: BUN 17; Creatinine, Ser 0.95; Magnesium 2.1; NT-Pro BNP 43; Potassium 3.9; Sodium 137; TSH 1.100  Recent Lipid Panel    Component Value Date/Time   CHOL 289 (H) 03/06/2019 0941   TRIG 130  03/06/2019 0941   HDL 92 03/06/2019 0941   CHOLHDL 3.1 03/06/2019 0941   LDLCALC 175 (H) 03/06/2019 0941    Physical Exam:    VS:  BP 122/84   Pulse 83   Ht 5\' 4"  (1.626 m)   Wt 197 lb (89.4 kg)   LMP 05/25/2019 (Approximate)   SpO2 98%   Breastfeeding Unknown   BMI 33.81 kg/m     Wt Readings from Last 3 Encounters:  06/02/19 197 lb (89.4 kg)  04/29/19 198 lb (89.8 kg)  03/06/19 187 lb (84.8 kg)     GEN: Well nourished, well developed in no acute distress HEENT: Normal NECK: No JVD; No carotid bruits LYMPHATICS: No lymphadenopathy CARDIAC:  S1S2 noted,RRR, no murmurs, rubs, gallops RESPIRATORY:  Clear to auscultation without rales, wheezing or rhonchi  ABDOMEN: Soft, non-tender, non-distended, +bowel sounds, no guarding. EXTREMITIES: No edema, No cyanosis, no clubbing MUSCULOSKELETAL:  No edema; No deformity  SKIN: Warm and dry NEUROLOGIC:  Alert and oriented x 3, non-focal PSYCHIATRIC:  Normal affect, good insight  ASSESSMENT:    1. Essential hypertension   2. Mixed hyperlipidemia   3. Symptomatic PVCs    PLAN:    1.  Hypertension-her blood pressure seems to be improving today she is manually 122/84 mmHg.  Therefore I am going to continue the patient on her current antihypertensive regimen which includes diltiazem 240 daily, Lasix 40 mg daily.  2.  She has symptomatically improved from her PVC.  3.  She will continue her atorvastatin 20 mg daily repeat labs will be next visit which will be in 3 months.  The patient is in agreement with the above plan. The patient left the office in stable condition.  The patient will follow up in 3 months or sooner if needed  Medication Adjustments/Labs and Tests Ordered: Current medicines are reviewed at length with the patient today.  Concerns regarding medicines are outlined above.  No orders of the defined types were placed in this encounter.  No orders of the defined types were placed in this encounter.   Patient  Instructions  Medication Instructions:  Your physician recommends that you continue on your current medications as directed. Please refer to the Current Medication list given to you today.  *If you need a refill on your cardiac medications before your next appointment, please call your pharmacy*  Lab Work: None If you have labs (blood work) drawn today and your tests are completely normal, you will receive your results only by: Marland Kitchen MyChart Message (if you have MyChart) OR . A paper copy in the mail If you have any lab test that is abnormal or we need to change your treatment, we will call you to review the results.  Testing/Procedures: None  Follow-Up: At [facility], you and your health needs are our priority.  As part of our continuing mission to provide you with exceptional heart care, we have created designated Provider Care Teams.  These Care Teams include your primary Cardiologist (physician) and Advanced Practice Providers (APPs -  Physician Assistants and Nurse Practitioners) who all work together to provide you with the care you need, when you need it.  Your next appointment:   3 month(s)  The format for your next appointment:   In Person  Provider:   Berniece Salines, DO  Other Instructions      Adopting a Healthy Lifestyle.  Know what a healthy weight is for you (roughly BMI <25) and aim to maintain this   Aim for 7+ servings of fruits and vegetables daily   65-80+ fluid ounces of water or unsweet tea for healthy kidneys   Limit to max 1 drink of alcohol per day; avoid smoking/tobacco   Limit animal fats in diet for cholesterol and heart health - choose grass fed whenever available   Avoid highly processed foods, and foods high in saturated/trans fats   Aim for low stress - take time to unwind and care for your mental health   Aim for 150 min of moderate intensity exercise weekly for heart health, and weights twice weekly for bone health   Aim for 7-9 hours  of sleep daily   When it comes to diets, agreement about the perfect  plan isnt easy to find, even among the experts. Experts at the [employer] developed an idea known as the Healthy Eating Plate. Just imagine a plate divided into logical, healthy portions.   The emphasis is on diet quality:   Load up on vegetables and fruits - one-half of your plate: Aim for color and variety, and remember that potatoes dont count.   Go for whole grains - one-quarter of your plate: Whole wheat, barley, wheat berries, quinoa, oats, brown rice, and foods made with them. If you want pasta, go with whole wheat pasta.   Protein power - one-quarter of your plate: Fish, chicken, beans, and nuts are all healthy, versatile protein sources. Limit red meat.   The diet, however, does go beyond the plate, offering a few other suggestions.   Use healthy plant oils, such as olive, canola, soy, corn, sunflower and peanut. Check the labels, and avoid partially hydrogenated oil, which have unhealthy trans fats.   If youre thirsty, drink water. Coffee and tea are good in moderation, but skip sugary drinks and limit milk and dairy products to one or two daily servings.   The type of carbohydrate in the diet is more important than the amount. Some sources of carbohydrates, such as vegetables, fruits, whole grains, and beans-are healthier than others.   Finally, stay active  Signed, Berniece Salines, DO  06/02/2019 8:35 AM    Claire Sharp

## 2019-06-02 NOTE — Patient Instructions (Signed)
Medication Instructions:  Your physician recommends that you continue on your current medications as directed. Please refer to the Current Medication list given to you today.  *If you need a refill on your cardiac medications before your next appointment, please call your pharmacy*  Lab Work: None If you have labs (blood work) drawn today and your tests are completely normal, you will receive your results only by: Marland Kitchen MyChart Message (if you have MyChart) OR . A paper copy in the mail If you have any lab test that is abnormal or we need to change your treatment, we will call you to review the results.  Testing/Procedures: None  Follow-Up: At [facility], you and your health needs are our priority.  As part of our continuing mission to provide you with exceptional heart care, we have created designated Provider Care Teams.  These Care Teams include your primary Cardiologist (physician) and Advanced Practice Providers (APPs -  Physician Assistants and Nurse Practitioners) who all work together to provide you with the care you need, when you need it.  Your next appointment:   3 month(s)  The format for your next appointment:   In Person  Provider:   Berniece Salines, DO  Other Instructions

## 2019-07-29 ENCOUNTER — Telehealth: Payer: Self-pay | Admitting: *Deleted

## 2019-07-29 NOTE — Telephone Encounter (Signed)
Sent progress note, ekg, and echo results to [employer] [facility] [employer] for insurance purposes.

## 2019-08-31 ENCOUNTER — Ambulatory Visit: Payer: Medicaid Other | Admitting: Cardiology

## 2019-09-02 ENCOUNTER — Other Ambulatory Visit: Payer: Self-pay

## 2019-09-02 ENCOUNTER — Ambulatory Visit (INDEPENDENT_AMBULATORY_CARE_PROVIDER_SITE_OTHER): Payer: Medicaid Other | Admitting: Cardiology

## 2019-09-02 ENCOUNTER — Encounter: Payer: Self-pay | Admitting: Cardiology

## 2019-09-02 VITALS — BP 120/80 | HR 79 | Ht 64.0 in | Wt 201.0 lb

## 2019-09-02 DIAGNOSIS — I10 Essential (primary) hypertension: Secondary | ICD-10-CM | POA: Diagnosis not present

## 2019-09-02 DIAGNOSIS — E782 Mixed hyperlipidemia: Secondary | ICD-10-CM

## 2019-09-02 DIAGNOSIS — I471 Supraventricular tachycardia: Secondary | ICD-10-CM

## 2019-09-02 DIAGNOSIS — O903 Peripartum cardiomyopathy: Secondary | ICD-10-CM

## 2019-09-02 DIAGNOSIS — I493 Ventricular premature depolarization: Secondary | ICD-10-CM

## 2019-09-02 DIAGNOSIS — I4719 Other supraventricular tachycardia: Secondary | ICD-10-CM

## 2019-09-02 HISTORY — DX: Ventricular premature depolarization: I49.3

## 2019-09-02 HISTORY — DX: Peripartum cardiomyopathy: O90.3

## 2019-09-02 HISTORY — DX: Supraventricular tachycardia: I47.1

## 2019-09-02 NOTE — Patient Instructions (Signed)
Medication Instructions:  No medication change *If you need a refill on your cardiac medications before your next appointment, please call your pharmacy*   Lab Work: None ordered If you have labs (blood work) drawn today and your tests are completely normal, you will receive your results only by: Marland Kitchen MyChart Message (if you have MyChart) OR . A paper copy in the mail If you have any lab test that is abnormal or we need to change your treatment, we will call you to review the results.   Testing/Procedures: None ordered   Follow-Up: At [facility], you and your health needs are our priority.  As part of our continuing mission to provide you with exceptional heart care, we have created designated Provider Care Teams.  These Care Teams include your primary Cardiologist (physician) and Advanced Practice Providers (APPs -  Physician Assistants and Nurse Practitioners) who all work together to provide you with the care you need, when you need it.  We recommend signing up for the patient portal called "MyChart".  Sign up information is provided on this After Visit Summary.  MyChart is used to connect with patients for Virtual Visits (Telemedicine).  Patients are able to view lab/test results, encounter notes, upcoming appointments, etc.  Non-urgent messages can be sent to your provider as well.   To learn more about what you can do with MyChart, go to [email or web address].    Your next appointment:   6 month(s)  The format for your next appointment:   In Person  Provider:   Berniece Salines, DO   Other Instructions NA

## 2019-09-02 NOTE — Progress Notes (Signed)
Cardiology Office Note:    Date:  09/02/2019   ID:  Claire Sharp, DOB 24-Mar-1981, MRN [id number]  PCP:  Raina Mina., MD  Cardiologist:  Berniece Salines, DO  Electrophysiologist:  None   Referring MD: Raina Mina., MD   Chief Complaint  Patient presents with  . Follow-up  Follow-up visit postpartum cardiomyopathy  History of Present Illness:    Claire Sharp is a 39 y.o. female with a hx of hypertension, postpartum cardiomyopathy, obesity, obstructive sleep apnea, presented initially on October 23 to be evaluated for chest pain, palpitations and worsening shortness of breath.  At that time a ZIO monitor, his coronary CT as well as a transthoracic echocardiogram were ordered.  At that time she was hypertensive therefore placed patient on hydralazine to help with her blood pressure.  I saw the patient on April 29, 2019 after getting her testing the patient presented to discuss her testing as well as reported that she was experiencing significant headaches with her hydralazine.  Her ZIO monitor did show evidence of symptomatic PVCs, CCTA with 0 calcium and transthoracic echocardiogram with a normal LVEF.  At that time her blood pressure was elevated but not at goal.  At the conclusion of her visit I stop the nifedipine as well as the hydralazine.  Start the patient on diltiazem 240 daily to help with her symptomatic control of her PVC as well as her hypertension.  Did see the patient on June 02, 2019 at that time she appears to be doing well from a cardiovascular standpoint.  Her blood pressure was significantly improve on the Cardizem.  She still is on Lasix 40 mg daily.  She is here for 38-month follow-up.   Past Medical History:  Diagnosis Date  . Abnormal uterine bleeding 03/06/2019  . Anxiety disorder 07/24/2017   Alprazolam 15 pills. for 3 months or more.  . Chronic left-sided low back pain with sciatica 12/05/2017  . Class 1 obesity due to excess calories without serious  comorbidity with body mass index (BMI) of 31.0 to 31.9 in adult 07/24/2017  . Degenerative lumbar disc 12/06/2017  . Depression 04/04/2018   -History of depression for which she prior was on Wellbutrin and Xanax PRN -Wellbutrin was started to help her with both depression as well as smoking cessation but she stopped this a while back -Stopped PRN xanax once she conceived -EPDS 1 today -Offered referral to perinatal psych for ongoing management but she declines.  Last Assessment & Plan:  Doing well today, mood reported as good.  . Encounter for procreative genetic counseling 05/02/2018   Genetic counseling visit on 05/09/18  Aneuploidy screening/ testing:  cfDNA ([facility] Non-invasive Prenatal Screening); results negative: no aneuploidy detected. Fetal fraction 10%.  Carrier screening:  [x]  Cystic fibrosis - negative x 60 mutations [x]  Spinal muscular atrophy - analysis failed. Notified patient. Declines redraw. [x]  Hemoglobinopathy screening - normal adult hemoglobin  [x]  Karyot  . Encounter for supervision of elderly multigravida in first trimester, antepartum 04/04/2018   Dating: L/7  MOD: RCS at 39 weeks [ ]  schedule Baby name: [ ]    FWB/Screening:  [-] Carrier screening: CF/SMA/Hgb EP - discussed 11/21, declined > saw GC and opted for testing. CF/Hgb electrophoresis normal. SMA was unable to be completed (unclear reason; sample failed post amplification quality requirements). [-] Patient notified by [facility] and declines redraw [x]  Aneuploidy screening - NIPT neg [x]  A  . Essential hypertension 12/09/2018  . Galactorrhea not associated with childbirth 03/06/2019  . Glucose  intolerance of pregnancy 08/11/2018   3/30: 1 hr GTT 162 > [x]  3 hr GTT - 102/189/126/104. Fasting draw, however, was obtained 10/15 after administration of glucose and thus likely abnormally elevated. Would say patient passed test with only one abnormal level. Message out to nursing staff to notify patient, roffer nutrition, 08/19/18 [ ]   .  History of MTHFR mutation 04/04/2018   -Identified during work-up for RPL at [facility]  -Already on PNV w/ folic acid supplementation.  -No need for further intervention  Last Assessment & Plan:  -Identified during work-up for R[facility] at [facility].  -Already on PNV w/ folic acid supplementation.  -No need for further intervention  . Lentigo 12/05/2017  . Localized swelling, mass, or lump of lower extremity, bilateral 10/27/2018   Postpartum admission for orthopnea and LE swelling, BNP 990. Echo normal, no signs of heart failure. Patient remained HDS through hospital stay and discharge after diuresis and improved symptoms on 10/27/2018  . Malaise and fatigue 07/24/2017  . MVP (mitral valve prolapse) 07/24/2017  . Obstructive sleep apnea syndrome 07/24/2017   HST 09/02/17 AHI 5.7  . Other idiopathic scoliosis, lumbar region 12/06/2017  . Pain in pelvis 03/06/2019  . Pregnancy-induced hypertension with pulmonary edema 11/01/2018  . Primary osteoarthritis of both hips 12/06/2017   Seen on xrays 11/2017  . 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 Ruptured cyst of ovary 03/06/2019  . Tachycardia 07/24/2017  . Thyroid nodule 10/05/2016   Reported okay.  Would like updated  . Urinary tract infectious disease 03/06/2019  . Vitamin D deficiency 10/05/2016    Past Surgical History:  Procedure Laterality Date  . CESAREAN SECTION     05/24/06 and 12/10/02  . DILATION AND EVACUATION N/A 12/08/2014   Procedure: DILATATION AND EVACUATION;  Surgeon: Vanessa Kick, MD;  Location:  [place] [facility];  Service: Gynecology;  Laterality: N/A;  . PARATHYROIDECTOMY      Current Medications: Current Meds  Medication Sig  . ALPRAZolam (XANAX) 0.5 MG tablet Take 0.5 mg by mouth at bedtime as needed.  Marland Kitchen atorvastatin (LIPITOR) 20 MG tablet Take 1 tablet (20 mg total) by mouth daily.  . celecoxib (CELEBREX) 200 MG capsule Take 200 mg by mouth daily.  Marland Kitchen diltiazem (CARDIZEM CD) 240 MG 24 hr capsule Take 1 capsule (240 mg total) by mouth daily.  Marland Kitchen escitalopram (LEXAPRO) 10 MG tablet Take 10 mg by mouth daily.  . furosemide (LASIX) 20 MG tablet Take 40 mg (2 tabs) in the AM and 20 mg (1 tab) in the PM  . ibuprofen (ADVIL,MOTRIN) 600 MG tablet Take 1 tablet (600 mg total) by mouth every 6 (six) hours as needed.     Allergies:   Patient has no known allergies.   Social History   Socioeconomic History  . Marital status: Married    Spouse name: Not on file  . Number of children: Not on file  . Years of education: Not on file  . Highest education level: Not on file  Occupational History  . Not on file  Tobacco Use  . Smoking status: Former Smoker    Quit date: 03/21/2018    Years since quitting: 1.4  . Smokeless tobacco: Never Used  Substance and Sexual Activity  . Alcohol use: Not Currently  . Drug use: Never  . Sexual activity: Not on file  Other Topics Concern  . Not on file  Social History Narrative  . Not on file   Social Determinants of Health   Financial Resource Strain:   . Difficulty of Paying Living Expenses:   Food Insecurity:   . Worried About Charity fundraiser in the Last Year:   . Arboriculturist in the Last Year:   Transportation Needs:   . Film/video editor (Medical):   Marland Kitchen Lack of Transportation (Non-Medical):   Physical Activity:   . Days of Exercise per Week:   . Minutes of Exercise per Session:   Stress:   . Feeling of Stress :   Social Connections:   . Frequency of Communication with Friends and Family:   . Frequency of Social Gatherings  with Friends and Family:   . Attends Religious Services:   . Active Member of Clubs or Organizations:   . Attends Archivist Meetings:   Marland Kitchen Marital Status:      Family History: The patient's family history includes Aneurysm in her maternal grandfather; Coronary artery disease in her father and mother; Heart attack in her maternal grandfather; Hyperlipidemia in her father; Hypertension in her father and mother; Liver disease in her mother; Rheum arthritis in her sister; Scoliosis in her sister.  ROS:   Review of Systems  Constitution: Negative for decreased appetite, fever and weight gain.  HENT: Negative for congestion, ear discharge, hoarse voice and sore throat.   Eyes: Negative for discharge, redness, vision loss in right eye and visual halos.  Cardiovascular: Negative for chest pain, dyspnea on exertion, leg swelling, orthopnea and palpitations.  Respiratory: Negative for cough, hemoptysis, shortness of breath and snoring.   Endocrine: Negative for heat intolerance and polyphagia.  Hematologic/Lymphatic: Negative for bleeding problem. Does not bruise/bleed easily.  Skin: Negative for flushing, nail changes, rash and suspicious lesions.  Musculoskeletal: Negative for arthritis, joint pain, muscle cramps, myalgias, neck pain and stiffness.  Gastrointestinal: Negative for abdominal pain, bowel incontinence, diarrhea and excessive appetite.  Genitourinary: Negative for decreased libido, genital sores and incomplete emptying.  Neurological: Negative for brief paralysis, focal weakness, headaches and loss of balance.  Psychiatric/Behavioral: Negative for altered mental status, depression and suicidal ideas.  Allergic/Immunologic: Negative for HIV exposure and persistent infections.    EKGs/Labs/Other Studies Reviewed:    The following studies were reviewed today:   EKG: None today   CTA coronaryFINDINGS: Non-cardiac: See separate report from [facility]  Radiology. Pulmonary veins drained normally to the left atrium. Calcium Score: 0 Agatston units. Coronary Arteries: Right dominant with no anomalies LM: No plaque or stenosis. LAD system: No plaque or stenosis. Circumflex system: Small to moderate ramus. No plaque or stenosis in the LCx system. RCA system: No plaque or stenosis. IMPRESSION: 1. Coronary artery calcium score 0 Agatston units, suggesting low risk for future cardiac events. 2. No significant coronary disease noted  Transthoracic echocardiogramIMPRESSIONS 1. Left ventricular ejection fraction, by visual estimation, is 60 to 65%. The left ventricle has normal function. Normal left ventricular size. There is no left ventricular hypertrophy.  FINDINGS Left Ventricle: Left ventricular ejection fraction, by visual estimation, is 60 to 65%.  The left ventricle has normal function. There is no left ventricular hypertrophy. Normal left ventricular size.  Right Ventricle: The right ventricular size is normal. No increase in right ventricular wall thickness. Global RV systolic function is has normal systolic function. The tricuspid regurgitant velocity is 2.20 m/s, and with an assumed right atrial pressure of 3 mmHg, the estimated right ventricular systolic pressure is normal at 22.4 mmHg.  Left Atrium: Left atrial size was normal in size.  Right Atrium: Right atrial size was normal in size  Pericardium: There is no evidence of pericardial effusion.  Mitral Valve: The mitral valve is normal in structure. No evidence of mitral valve stenosis by observation. No evidence of mitral valve regurgitation.  Tricuspid Valve: The tricuspid valve is normal in structure. Tricuspid valve regurgitation is trivial by color flow Doppler.  Aortic Valve: The aortic valve is normal in structure. Aortic valve regurgitation was not visualized by color flow Doppler. The aortic valve is structurally normal, with no evidence of sclerosis or  stenosis.  Pulmonic Valve: The pulmonic valve was normal in structure. Pulmonic valve regurgitation is not visualized by color flow Doppler.  Aorta: The aortic root, ascending aorta and aortic arch are all structurally normal, with no evidence of dilitation or obstruction.  Venous: The inferior vena cava is normal in size with greater than 50% respiratory variability, suggesting right atrial pressure of 3 mmHg.  IAS/Shunts: No atrial level shunt detected by color flow Doppler. No ventricular septal defect is seen or detected. There is no evidence of an atrial septal defect.  ZIO monitor indication: Palpitations The minimal heart rate was 49 bpm, the maximum heart rate was 190 bpm, the average heart rate was 83 bpm.  The predominant underlying rhythm was sinus. Slow P wave morphology change was noted. Idioventricular rhythm was also present.  There was 1 run of supraventricular tachycardia lasting 11 beats with a maximum heart rate of 190 bpm ( average HR was 179 bpm). The episodes of supraventricular tachycardia was associated with a patient triggered event. Premature atrial complexes were rare (<1.0%). Premature ventricular complexes were rare (<1.0%,1822) with VE Couplets (<1.0%, 36), and VE Triplets (<1.0%, 7).  Ventricular Bigeminy and Trigeminy were present. 12 patient triggered events were noted: 7 associated with premature ventricular complex and 1 associated with the run of supraventricular tachycardia the remaining patient triggered event was associated with sinus rhythm. No pauses, no AV block, no significant tachycardia and no atrial fibrillation was present.  Conclusion: This study is remarkable for paroxysmal/symptomatic supraventricular tachycardia which is likely atrial tachycardia think symptomatic premature ventricular complexes.  Recent Labs: 03/06/2019: BUN 17; Creatinine, Ser 0.95; Magnesium 2.1; NT-Pro BNP 43; Potassium 3.9; Sodium 137; TSH 1.100  Recent  Lipid Panel    Component Value Date/Time   CHOL 289 (H) 03/06/2019 0941   TRIG 130 03/06/2019 0941   HDL 92 03/06/2019 0941   CHOLHDL 3.1 03/06/2019 0941   LDLCALC 175 (H) 03/06/2019 0941    Physical Exam:    VS:  BP 120/80 (BP Location: Left Arm, Patient Position: Sitting, Cuff Size: Normal)   Pulse 79   Ht 5\' 4"  (1.626 m)   Wt 201 lb (91.2 kg)   SpO2 96%   BMI 34.50 kg/m     Wt Readings from Last 3 Encounters:  09/02/19 201 lb (91.2 kg)  06/02/19 197 lb (89.4 kg)  04/29/19 198 lb (89.8 kg)     GEN: Well nourished, well developed in no acute distress HEENT: Normal NECK: No  JVD; No carotid bruits LYMPHATICS: No lymphadenopathy CARDIAC: S1S2 noted,RRR, no murmurs, rubs, gallops RESPIRATORY:  Clear to auscultation without rales, wheezing or rhonchi  ABDOMEN: Soft, non-tender, non-distended, +bowel sounds, no guarding. EXTREMITIES: No edema, No cyanosis, no clubbing MUSCULOSKELETAL:  No deformity  SKIN: Warm and dry NEUROLOGIC:  Alert and oriented x 3, non-focal PSYCHIATRIC:  Normal affect, good insight  ASSESSMENT:    1. Symptomatic PVCs   2. PAT (paroxysmal atrial tachycardia) ([place])   3. Mixed hyperlipidemia   4. Essential hypertension   5. Postpartum cardiomyopathy    PLAN:    1.  She is very happy because her symptoms has improved significantly and has not really had any symptoms since we placed her on a Cardizem.  I am going to continue this for now.  2.  Hyperlipidemia continue patient on her current statin medication.  3.  Hypertension her blood pressure is acceptable continue patient on her Cardizem to 40 mg daily.  4.  Postpartum cardiomyopathy-her EF has improved since the last echo.  She is only using her Lasix on as-needed basis I am okay with that.  I will see this patient in 6 months.     The patient is in agreement with the above plan. The patient left the office in stable condition.  The patient will follow up in 6 months or sooner if  needed.   Medication Adjustments/Labs and Tests Ordered: Current medicines are reviewed at length with the patient today.  Concerns regarding medicines are outlined above.  No orders of the defined types were placed in this encounter.  No orders of the defined types were placed in this encounter.   There are no Patient Instructions on file for this visit.   Adopting a Healthy Lifestyle.  Know what a healthy weight is for you (roughly BMI <25) and aim to maintain this   Aim for 7+ servings of fruits and vegetables daily   65-80+ fluid ounces of water or unsweet tea for healthy kidneys   Limit to max 1 drink of alcohol per day; avoid smoking/tobacco   Limit animal fats in diet for cholesterol and heart health - choose grass fed whenever available   Avoid highly processed foods, and foods high in saturated/trans fats   Aim for low stress - take time to unwind and care for your mental health   Aim for 150 min of moderate intensity exercise weekly for heart health, and weights twice weekly for bone health   Aim for 7-9 hours of sleep daily   When it comes to diets, agreement about the perfect plan isnt easy to find, even among the experts. Experts at the [employer] developed an idea known as the Healthy Eating Plate. Just imagine a plate divided into logical, healthy portions.   The emphasis is on diet quality:   Load up on vegetables and fruits - one-half of your plate: Aim for color and variety, and remember that potatoes dont count.   Go for whole grains - one-quarter of your plate: Whole wheat, barley, wheat berries, quinoa, oats, brown rice, and foods made with them. If you want pasta, go with whole wheat pasta.   Protein power - one-quarter of your plate: Fish, chicken, beans, and nuts are all healthy, versatile protein sources. Limit red meat.   The diet, however, does go beyond the plate, offering a few other suggestions.   Use healthy plant  oils, such as olive, canola, soy, corn, sunflower and peanut. Check  the labels, and avoid partially hydrogenated oil, which have unhealthy trans fats.   If youre thirsty, drink water. Coffee and tea are good in moderation, but skip sugary drinks and limit milk and dairy products to one or two daily servings.   The type of carbohydrate in the diet is more important than the amount. Some sources of carbohydrates, such as vegetables, fruits, whole grains, and beans-are healthier than others.   Finally, stay active  Signed, Berniece Salines, DO  09/02/2019 9:49 AM    [place]

## 2019-10-11 ENCOUNTER — Other Ambulatory Visit: Payer: Self-pay | Admitting: Cardiology

## 2019-12-15 ENCOUNTER — Other Ambulatory Visit: Payer: Self-pay | Admitting: Cardiology

## 2020-01-29 NOTE — Progress Notes (Signed)
Office Visit Note  Patient: Claire Sharp             Date of Birth: 02/19/1981           MRN: [id number]             PCP: Raina Mina., MD Referring: Bess Harvest* Visit Date: 02/12/2020 Occupation: @GUAROCC @  Subjective:  Pain in multiple joints.   History of Present Illness: Claire Sharp is a 39 y.o. female seen in consultation per request of her PCP.  According to patient her symptoms a started at age 16 with bilateral hip pain.  She states she was eventually diagnosed with trochanteric bursitis and had some injections.  About 3 years ago she started experiencing increased pain and discomfort in her joints which she describes in her both shoulders, both elbows, both wrist, both hands, both hips, left knee, both ankles and her feet.  She states she gave birth to her daughter on October 21, 2018 after that the joint symptoms got worse.  She notices swelling in her both hands, her ankles and feet.  She has had longstanding problems with neck is stiffness and was diagnosed with degenerative disc disease of lumbar spine.  During the postpartum.  She started having palpitations and chest pain and was seen by cardiologist.  She was given the diagnosis of PVCs and postpartum cardiomyopathy.  She is followed by cardiology.  She states due to hip joint pain she has been is scheduled to get hip joint injection on March 01, 2020.  She has been going to pain clinic for her lower back pain.  According to patient she may need nerve block for her lower back.  There is history of rheumatoid arthritis on maternal side and also in her sister.  There is no history of oral ulcers, nasal ulcers, malar rash, photosensitivity, Raynaud's phenomenon or lymphadenopathy.  She is gravida 8, para 4 miscarriages 4.  Activities of Daily Living:  Patient reports morning stiffness for  30 minutes.   Patient Reports nocturnal pain.  Difficulty dressing/grooming: Reports Difficulty climbing stairs: Reports Difficulty  getting out of chair: Reports Difficulty using hands for taps, buttons, cutlery, and/or writing: Reports  Review of Systems  Constitutional: Positive for fatigue. Negative for night sweats, weight gain and weight loss.  HENT: Negative for mouth sores, trouble swallowing, trouble swallowing, mouth dryness and nose dryness.   Eyes: Negative for pain, redness, itching, visual disturbance and dryness.  Respiratory: Positive for shortness of breath and wheezing. Negative for cough and difficulty breathing.   Cardiovascular: Positive for palpitations. Negative for chest pain, hypertension, irregular heartbeat and swelling in legs/feet.  Gastrointestinal: Negative for blood in stool, constipation and diarrhea.  Endocrine: Negative for increased urination.  Genitourinary: Negative for difficulty urinating, painful urination and vaginal dryness.  Musculoskeletal: Positive for arthralgias, joint pain, joint swelling, myalgias, morning stiffness, muscle tenderness and myalgias. Negative for muscle weakness.  Skin: Negative for color change, rash, hair loss, redness, skin tightness, ulcers and sensitivity to sunlight.  Allergic/Immunologic: Negative for susceptible to infections.  Neurological: Negative for dizziness, headaches, memory loss, night sweats and weakness.  Hematological: Negative for bruising/bleeding tendency and swollen glands.  Psychiatric/Behavioral: Positive for sleep disturbance. Negative for depressed mood and confusion. The patient is nervous/anxious.     PMFS History:  Patient Active Problem List   Diagnosis Date Noted  . Symptomatic PVCs 09/02/2019  . PAT (paroxysmal atrial tachycardia) ([place]) 09/02/2019  . 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 Abnormal uterine bleeding  03/06/2019  . Galactorrhea not associated with childbirth 03/06/2019  . Pain in pelvis 03/06/2019  . Ruptured cyst of ovary 03/06/2019  . Urinary tract infectious disease 03/06/2019  . Dyspnea 03/06/2019  .  Palpitations 03/06/2019  . Mixed hyperlipidemia 03/06/2019  . Chronic heart failure with preserved ejection fraction ([place]) 03/06/2019  . Abnormal EKG 03/06/2019  . Essential hypertension 12/09/2018  . Pregnancy-induced hypertension with pulmonary edema 11/01/2018  . Localized swelling, mass, or lump of lower extremity, bilateral 10/27/2018  . Glucose intolerance of pregnancy 08/11/2018  . Encounter for procreative genetic counseling 05/02/2018  . Depression 04/04/2018  . Encounter for supervision of elderly multigravida in first trimester, antepartum 04/04/2018  . Former smoker 04/04/2018  . History of cesarean delivery 04/04/2018  . History of MTHFR mutation 04/04/2018  . Recurrent pregnancy loss in pregnant patient in first trimester, antepartum 04/04/2018  . Degenerative lumbar disc 12/06/2017  . Other idiopathic scoliosis, lumbar region 12/06/2017  . Primary osteoarthritis of both hips 12/06/2017  . Chronic left-sided low back pain with sciatica 12/05/2017  . Lentigo 12/05/2017  . MTHFR mutation 07/25/2017  . Anxiety disorder 07/24/2017  . Class 1 obesity due to excess calories without serious comorbidity with body mass index (BMI) of 31.0 to 31.9 in adult 07/24/2017  . Malaise and fatigue 07/24/2017  . MVP (mitral valve prolapse) 07/24/2017  . Obstructive sleep apnea syndrome 07/24/2017  . Tachycardia 07/24/2017  . Tobacco dependence 07/24/2017  . Thyroid nodule 10/05/2016  . Vitamin D deficiency 10/05/2016    Past Medical History:  Diagnosis Date  . Abnormal uterine bleeding 03/06/2019  . Anxiety disorder 07/24/2017   Alprazolam 15 pills. for 3 months or more.  . Chronic left-sided low back pain with sciatica 12/05/2017  . Class 1 obesity due to excess calories without serious comorbidity with body mass index (BMI) of 31.0 to 31.9 in adult 07/24/2017  . Degenerative lumbar disc 12/06/2017  . Depression 04/04/2018   -History of depression for which she prior was on Wellbutrin  and Xanax PRN -Wellbutrin was started to help her with both depression as well as smoking cessation but she stopped this a while back -Stopped PRN xanax once she conceived -EPDS 1 today -Offered referral to perinatal psych for ongoing management but she declines.  Last Assessment & Plan:  Doing well today, mood reported as good.  . Encounter for procreative genetic counseling 05/02/2018   Genetic counseling visit on 05/09/18  Aneuploidy screening/ testing:  cfDNA ([facility] Non-invasive Prenatal Screening); results negative: no aneuploidy detected. Fetal fraction 10%.  Carrier screening:  [x]  Cystic fibrosis - negative x 60 mutations [x]  Spinal muscular atrophy - analysis failed. Notified patient. Declines redraw. [x]  Hemoglobinopathy screening - normal adult hemoglobin  [x]  Karyot  . Encounter for supervision of elderly multigravida in first trimester, antepartum 04/04/2018   Dating: L/7  MOD: RCS at 39 weeks [ ]  schedule Baby name: [ ]    FWB/Screening:  [-] Carrier screening: CF/SMA/Hgb EP - discussed 11/21, declined > saw GC and opted for testing. CF/Hgb electrophoresis normal. SMA was unable to be completed (unclear reason; sample failed post amplification quality requirements). [-] Patient notified by [facility] and declines redraw [x]  Aneuploidy screening - NIPT neg [x]  A  . Essential hypertension 12/09/2018  . Galactorrhea not associated with childbirth 03/06/2019  . Glucose intolerance of pregnancy 08/11/2018   3/30: 1 hr GTT 162 > [x]  3 hr GTT - 102/189/126/104. Fasting draw, however, was obtained 10/15 after administration of glucose and thus likely abnormally elevated.  Would say patient passed test with only one abnormal level. Message out to nursing staff to notify patient, roffer nutrition, 08/19/18 [ ]   . History of MTHFR mutation 04/04/2018   -Identified during work-up for RPL at [facility].  -Already on PNV w/ folic acid supplementation.  -No need for further intervention  Last Assessment & Plan:   -Identified during work-up for RPL at [facility].  -Already on PNV w/ folic acid supplementation.  -No need for further intervention  . Lentigo 12/05/2017  . Localized swelling, mass, or lump of lower extremity, bilateral 10/27/2018   Postpartum admission for orthopnea and LE swelling, BNP 990. Echo normal, no signs of heart failure. Patient remained HDS through hospital stay and discharge after diuresis and improved symptoms on 10/27/2018  . Malaise and fatigue 07/24/2017  . MVP (mitral valve prolapse) 07/24/2017  . Obstructive sleep apnea syndrome 07/24/2017   HST 09/02/17 AHI 5.7  . Other idiopathic scoliosis, lumbar region 12/06/2017  . Pain in pelvis 03/06/2019  . Pregnancy-induced hypertension with pulmonary edema 11/01/2018  . Primary osteoarthritis of both hips 12/06/2017   Seen on xrays 11/2017  . 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 Ruptured cyst of ovary 03/06/2019  . Tachycardia 07/24/2017  . Thyroid nodule 10/05/2016   Reported okay.  Would like updated  . Urinary tract infectious disease 03/06/2019  . Vitamin D deficiency 10/05/2016    Family History  Problem Relation Age of Onset  . Hypertension Mother   . Liver disease Mother   . Coronary artery disease Mother   . Hyperlipidemia Father   . Hypertension Father   . Coronary artery disease Father   . Rheum arthritis Sister   . Scoliosis Sister   . Aneurysm Maternal Grandfather   . Heart attack Maternal Grandfather   . Healthy Daughter   . Healthy Daughter   . Healthy Daughter   .  Asthma Daughter   . Healthy Daughter    Past Surgical History:  Procedure Laterality Date  . CESAREAN SECTION     05/24/06, 12/10/02, 10/21/2018  . DILATION AND EVACUATION N/A 12/08/2014   Procedure: DILATATION AND EVACUATION;  Surgeon: Vanessa Kick, MD;  Location: [facility];  Service: Gynecology;  Laterality: N/A;  . PARATHYROIDECTOMY    . tubal reversal  2016   Social History   Social History Narrative  . Not on file    There is no immunization history on file for this patient.   Objective: Vital Signs: BP 127/83 (BP Location: Right Arm, Patient Position: Sitting, Cuff Size: Normal)   Pulse 70   Resp 15   Ht 5\' 4"  (1.626 m)   Wt 217 lb (98.4 kg)   BMI 37.25 kg/m    Physical Exam Vitals and nursing note reviewed.  Constitutional:      Appearance: She is well-developed.  HENT:     Head: Normocephalic and atraumatic.  Eyes:     Conjunctiva/sclera: Conjunctivae normal.  Cardiovascular:     Rate and Rhythm: Normal  rate and regular rhythm.     Heart sounds: Normal heart sounds.  Pulmonary:     Effort: Pulmonary effort is normal.     Breath sounds: Normal breath sounds.  Abdominal:     General: Bowel sounds are normal.     Palpations: Abdomen is soft.  Musculoskeletal:     Cervical back: Normal range of motion.  Lymphadenopathy:     Cervical: No cervical adenopathy.  Skin:    General: Skin is warm and dry.     Capillary Refill: Capillary refill takes less than 2 seconds.  Neurological:     Mental Status: She is alert and oriented to person, place, and time.  Psychiatric:        Behavior: Behavior normal.      Musculoskeletal Exam: C-spine was in good range of motion without any discomfort.  She has painful range of motion of her lumbar spine.  She has some tenderness over SI joints.  Shoulder joints, elbow joints, wrist joints, MCPs PIPs and DIPs been good range of motion with no synovitis.  She had tenderness on palpation of her bilateral trapezius area and bilateral  lateral epicondyle region.  She had tenderness over gluteal region.  She had tenderness over bilateral trochanteric bursa.  Hip joints, knee joints, ankles, MTPs and PIPs with good range of motion with no synovitis.  She has bilateral pes cavus.  She had tenderness across MTP joints with no synovitis  CDAI Exam: CDAI Score: -- Patient Global: --; Provider Global: -- Swollen: --; Tender: -- Joint Exam 02/12/2020   No joint exam has been documented for this visit   There is currently no information documented on the homunculus. Go to the Rheumatology activity and complete the homunculus joint exam.  Investigation: No additional findings.  Imaging: XR HIPS BILAT W OR W/O PELVIS 3-4 VIEWS  Result Date: 02/12/2020 No SI joint or hip joint narrowing was noted.  No chondrocalcinosis was noted. Impression: Unremarkable x-ray of the hip joints.  XR Foot 2 Views Left  Result Date: 02/12/2020 PIP and DIP narrowing was noted.  No MTP, intertarsal, tibiotalar joint space narrowing was noted.  No subtalar narrowing was noted.  Inferior calcaneal spur was noted. Impression: These findings are consistent with early osteoarthritic changes.  XR Foot 2 Views Right  Result Date: 02/12/2020 First MTP, PIP and DIP narrowing was noted.  No intertarsal, tibiotalar or subtalar joint space narrowing was noted.  No erosive changes were noted. Impression: These findings are consistent with mild osteoarthritis.  XR Hand 2 View Left  Result Date: 02/12/2020 No MCP, PIP or DIP narrowing was noted.  No intercarpal or radiocarpal joint space narrowing was noted.  No erosive changes were noted. Impression: Unremarkable x-ray of the hand.  XR Hand 2 View Right  Result Date: 02/12/2020 No MCP, PIP or DIP narrowing was noted.  No intercarpal or radiocarpal joint space narrowing was noted.  No erosive changes were noted. Impression: Unremarkable x-ray of the hand.   Recent Labs: Lab Results  Component Value Date    WBC 6.9 12/08/2014   HGB 13.1 12/08/2014   PLT 160 12/08/2014   NA 137 03/06/2019   K 3.9 03/06/2019   CL 98 03/06/2019   CO2 25 03/06/2019   GLUCOSE 86 03/06/2019   BUN 17 03/06/2019   CREATININE 0.95 03/06/2019   CALCIUM 9.5 03/06/2019   GFRAA 87 03/06/2019    Speciality Comments: No specialty comments available.  Procedures:  No procedures performed Allergies: Patient has  no known allergies.   Assessment / Plan:     Visit Diagnoses: Polyarthralgia -she complains of pain in multiple joints.  No synovitis was noted on examination today.  All the autoimmune work-up has been negative so far.  She gives history of intermittent swelling.  09/02/19: RF-, ANA-, CCP<5, smith-  Pain in both hands -she complains of pain and discomfort in bilateral hands which she describes over first 3 MCPs and PIPs in bilateral hands.  She states she has decreased grip strength.  She had no tenderness or synovitis on my examination today.  I will schedule ultrasound of bilateral hands.  Plan: XR Hand 2 View Right, XR Hand 2 View Left.  X-ray of bilateral hands were unremarkable.  Chronic pain of both hips -patient states she has been diagnosed with arthritis in her hip joint.  She will be getting cortisone injection to her hip joint but at pain clinic.  She had good range of motion of bilateral hip joints.  She had bilateral trochanteric bursitis.  Plan: XR HIPS BILAT W OR W/O PELVIS 3-4 VIEWS.  X-ray of bilateral hip joints were unremarkable.  Bilateral trochanteric bursitis-she has had injections in the past.  She would benefit from IT band exercises.  A handout for IT band exercises was given.  Primary osteoarthritis of both hips-  Pain in both feet -she complains of pain and discomfort in her bilateral feet.  She had difficulty walking on her tiptoes and on her heels.  No synovitis was noted.  Plan: XR Foot 2 Views Right, XR Foot 2 Views Left.  Mild osteoarthritic changes were noted.  Pes cavus-she has  bilateral pes cavus.  She would benefit from arch support.  DDD (degenerative disc disease), lumbar-she complains of pain and discomfort in her lower back.  She states she has been diagnosed with degenerative disc disease of lumbar spine and has been followed at pain clinic.  Other idiopathic scoliosis, lumbar region-she has been diagnosed with spinal scoliosis based on previous x-rays.  Essential hypertension-her blood pressure is well controlled today.  Other medical problems are listed as follows:  MVP (mitral valve prolapse)  Symptomatic PVCs  Postpartum cardiomyopathy  Chronic heart failure with preserved ejection fraction (HCC)  Mixed hyperlipidemia  Thyroid nodule  History of depression  OSA (obstructive sleep apnea)  Vitamin D deficiency  Vitamin B12 deficiency  MTHFR mutation  Family history of rheumatoid arthritis - Sister and several family members on maternal side.  Orders: Orders Placed This Encounter  Procedures  . XR Hand 2 View Right  . XR Hand 2 View Left  . XR HIPS BILAT W OR W/O PELVIS 3-4 VIEWS  . XR Foot 2 Views Right  . XR Foot 2 Views Left  . CBC with Differential/Platelet  . COMPLETE METABOLIC PANEL WITH GFR  . Sedimentation rate  . 14-3-3 eta Protein  . CK   No orders of the defined types were placed in this encounter.    Follow-Up Instructions: Return for Pain in multiple joints.   Bo Merino, MD  Note - This record has been created using Editor, commissioning.  Chart creation errors have been sought, but may not always  have been located. Such creation errors do not reflect on  the standard of medical care.

## 2020-02-12 ENCOUNTER — Ambulatory Visit: Payer: Self-pay

## 2020-02-12 ENCOUNTER — Encounter: Payer: Self-pay | Admitting: Rheumatology

## 2020-02-12 ENCOUNTER — Ambulatory Visit (INDEPENDENT_AMBULATORY_CARE_PROVIDER_SITE_OTHER): Payer: Medicaid Other | Admitting: Rheumatology

## 2020-02-12 ENCOUNTER — Other Ambulatory Visit: Payer: Self-pay

## 2020-02-12 VITALS — BP 127/83 | HR 70 | Resp 15 | Ht 64.0 in | Wt 217.0 lb

## 2020-02-12 DIAGNOSIS — G8929 Other chronic pain: Secondary | ICD-10-CM

## 2020-02-12 DIAGNOSIS — M2550 Pain in unspecified joint: Secondary | ICD-10-CM

## 2020-02-12 DIAGNOSIS — M160 Bilateral primary osteoarthritis of hip: Secondary | ICD-10-CM | POA: Diagnosis not present

## 2020-02-12 DIAGNOSIS — M79642 Pain in left hand: Secondary | ICD-10-CM

## 2020-02-12 DIAGNOSIS — M4126 Other idiopathic scoliosis, lumbar region: Secondary | ICD-10-CM

## 2020-02-12 DIAGNOSIS — E538 Deficiency of other specified B group vitamins: Secondary | ICD-10-CM

## 2020-02-12 DIAGNOSIS — Q667 Congenital pes cavus, unspecified foot: Secondary | ICD-10-CM

## 2020-02-12 DIAGNOSIS — I5032 Chronic diastolic (congestive) heart failure: Secondary | ICD-10-CM

## 2020-02-12 DIAGNOSIS — M79672 Pain in left foot: Secondary | ICD-10-CM

## 2020-02-12 DIAGNOSIS — O903 Peripartum cardiomyopathy: Secondary | ICD-10-CM

## 2020-02-12 DIAGNOSIS — M7062 Trochanteric bursitis, left hip: Secondary | ICD-10-CM

## 2020-02-12 DIAGNOSIS — E782 Mixed hyperlipidemia: Secondary | ICD-10-CM

## 2020-02-12 DIAGNOSIS — M79641 Pain in right hand: Secondary | ICD-10-CM | POA: Diagnosis not present

## 2020-02-12 DIAGNOSIS — M25551 Pain in right hip: Secondary | ICD-10-CM

## 2020-02-12 DIAGNOSIS — M79671 Pain in right foot: Secondary | ICD-10-CM

## 2020-02-12 DIAGNOSIS — I10 Essential (primary) hypertension: Secondary | ICD-10-CM

## 2020-02-12 DIAGNOSIS — E559 Vitamin D deficiency, unspecified: Secondary | ICD-10-CM

## 2020-02-12 DIAGNOSIS — I341 Nonrheumatic mitral (valve) prolapse: Secondary | ICD-10-CM

## 2020-02-12 DIAGNOSIS — E041 Nontoxic single thyroid nodule: Secondary | ICD-10-CM

## 2020-02-12 DIAGNOSIS — M7061 Trochanteric bursitis, right hip: Secondary | ICD-10-CM

## 2020-02-12 DIAGNOSIS — G4733 Obstructive sleep apnea (adult) (pediatric): Secondary | ICD-10-CM

## 2020-02-12 DIAGNOSIS — R5383 Other fatigue: Secondary | ICD-10-CM

## 2020-02-12 DIAGNOSIS — Z8659 Personal history of other mental and behavioral disorders: Secondary | ICD-10-CM

## 2020-02-12 DIAGNOSIS — I493 Ventricular premature depolarization: Secondary | ICD-10-CM

## 2020-02-12 DIAGNOSIS — Z8261 Family history of arthritis: Secondary | ICD-10-CM

## 2020-02-12 DIAGNOSIS — Z1589 Genetic susceptibility to other disease: Secondary | ICD-10-CM

## 2020-02-12 DIAGNOSIS — M25552 Pain in left hip: Secondary | ICD-10-CM

## 2020-02-12 DIAGNOSIS — M5136 Other intervertebral disc degeneration, lumbar region: Secondary | ICD-10-CM

## 2020-02-12 NOTE — Patient Instructions (Signed)
Iliotibial Band Syndrome Rehab Ask your health care provider which exercises are safe for you. Do exercises exactly as told by your health care provider and adjust them as directed. It is normal to feel mild stretching, pulling, tightness, or discomfort as you do these exercises. Stop right away if you feel sudden pain or your pain gets significantly worse. Do not begin these exercises until told by your health care provider. Stretching and range-of-motion exercises These exercises warm up your muscles and joints and improve the movement and flexibility of your hip and pelvis. Quadriceps stretch, prone  1. Lie on your abdomen on a firm surface, such as a bed or padded floor (prone position). 2. Bend your left / right knee and reach back to hold your ankle or pant leg. If you cannot reach your ankle or pant leg, loop a belt around your foot and grab the belt instead. 3. Gently pull your heel toward your buttocks. Your knee should not slide out to the side. You should feel a stretch in the front of your thigh and knee (quadriceps). 4. Hold this position for __________ seconds. Repeat __________ times. Complete this exercise __________ times a day. Iliotibial band stretch An iliotibial band is a strong band of muscle tissue that runs from the outer side of your hip to the outer side of your thigh and knee. 1. Lie on your side with your left / right leg in the top position. 2. Bend both of your knees and grab your left / right ankle. Stretch out your bottom arm to help you balance. 3. Slowly bring your top knee back so your thigh goes behind your trunk. 4. Slowly lower your top leg toward the floor until you feel a gentle stretch on the outside of your left / right hip and thigh. If you do not feel a stretch and your knee will not fall farther, place the heel of your other foot on top of your knee and pull your knee down toward the floor with your foot. 5. Hold this position for __________  seconds. Repeat __________ times. Complete this exercise __________ times a day. Strengthening exercises These exercises build strength and endurance in your hip and pelvis. Endurance is the ability to use your muscles for a long time, even after they get tired. Straight leg raises, side-lying This exercise strengthens the muscles that rotate the leg at the hip and move it away from your body (hip abductors). 1. Lie on your side with your left / right leg in the top position. Lie so your head, shoulder, hip, and knee line up. You may bend your bottom knee to help you balance. 2. Roll your hips slightly forward so your hips are stacked directly over each other and your left / right knee is facing forward. 3. Tense the muscles in your outer thigh and lift your top leg 4-6 inches (10-15 cm). 4. Hold this position for __________ seconds. 5. Slowly return to the starting position. Let your muscles relax completely before doing another repetition. Repeat __________ times. Complete this exercise __________ times a day. Leg raises, prone This exercise strengthens the muscles that move the hips (hip extensors). 1. Lie on your abdomen on your bed or a firm surface. You can put a pillow under your hips if that is more comfortable for your lower back. 2. Bend your left / right knee so your foot is straight up in the air. 3. Squeeze your buttocks muscles and lift your left / right thigh   off the bed. Do not let your back arch. 4. Tense your thigh muscle as hard as you can without increasing any knee pain. 5. Hold this position for __________ seconds. 6. Slowly lower your leg to the starting position and allow it to relax completely. Repeat __________ times. Complete this exercise __________ times a day. Hip hike 1. Stand sideways on a bottom step. Stand on your left / right leg with your other foot unsupported next to the step. You can hold on to the railing or wall for balance if needed. 2. Keep your knees  straight and your torso square. Then lift your left / right hip up toward the ceiling. 3. Slowly let your left / right hip lower toward the floor, past the starting position. Your foot should get closer to the floor. Do not lean or bend your knees. Repeat __________ times. Complete this exercise __________ times a day. This information is not intended to replace advice given to you by your health care provider. Make sure you discuss any questions you have with your health care provider. Document Revised: 08/21/2018 Document Reviewed: 02/19/2018 [employer]vier Patient Education  2020 [employer]  

## 2020-02-22 NOTE — Progress Notes (Deleted)
Office Visit Note  Patient: Claire Sharp             Date of Birth: January 18, 1981           MRN: [id number]             PCP: Raina Mina., MD Referring: Raina Mina., MD Visit Date: 03/07/2020 Occupation: @GUAROCC @  Subjective:  No chief complaint on file.   History of Present Illness: Claire Sharp is a 39 y.o. female ***   Activities of Daily Living:  Patient reports morning stiffness for *** {minute/hour:19697}.   Patient {ACTIONS;DENIES/REPORTS:21021675::"Denies"} nocturnal pain.  Difficulty dressing/grooming: {ACTIONS;DENIES/REPORTS:21021675::"Denies"} Difficulty climbing stairs: {ACTIONS;DENIES/REPORTS:21021675::"Denies"} Difficulty getting out of chair: {ACTIONS;DENIES/REPORTS:21021675::"Denies"} Difficulty using hands for taps, buttons, cutlery, and/or writing: {ACTIONS;DENIES/REPORTS:21021675::"Denies"}  No Rheumatology ROS completed.   PMFS History:  Patient Active Problem List   Diagnosis Date Noted  . Symptomatic PVCs 09/02/2019  . PAT (paroxysmal atrial tachycardia) ([place]) 09/02/2019  . 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 Abnormal uterine bleeding 03/06/2019  . Galactorrhea not associated with childbirth 03/06/2019  . Pain in pelvis 03/06/2019  . Ruptured cyst of ovary 03/06/2019  . Urinary tract infectious disease 03/06/2019  . Dyspnea 03/06/2019  . Palpitations 03/06/2019  . Mixed hyperlipidemia 03/06/2019  . Chronic heart failure with preserved ejection fraction ([place]) 03/06/2019  . Abnormal EKG 03/06/2019  . Essential hypertension 12/09/2018  . Pregnancy-induced hypertension with pulmonary edema 11/01/2018  . Localized swelling, mass, or lump of lower extremity, bilateral 10/27/2018  . Glucose intolerance of pregnancy 08/11/2018  . Encounter for procreative genetic counseling 05/02/2018  . Depression 04/04/2018  . Encounter for supervision of elderly multigravida in first trimester, antepartum 04/04/2018  . Former smoker 04/04/2018  . History of  cesarean delivery 04/04/2018  . History of MTHFR mutation 04/04/2018  . Recurrent pregnancy loss in pregnant patient in first trimester, antepartum 04/04/2018  . Degenerative lumbar disc 12/06/2017  . Other idiopathic scoliosis, lumbar region 12/06/2017  . Primary osteoarthritis of both hips 12/06/2017  . Chronic left-sided low back pain with sciatica 12/05/2017  . Lentigo 12/05/2017  . MTHFR mutation 07/25/2017  . Anxiety disorder 07/24/2017  . Class 1 obesity due to excess calories without serious comorbidity with body mass index (BMI) of 31.0 to 31.9 in adult 07/24/2017  . Malaise and fatigue 07/24/2017  . MVP (mitral valve prolapse) 07/24/2017  . Obstructive sleep apnea syndrome 07/24/2017  . Tachycardia 07/24/2017  . Tobacco dependence 07/24/2017  . Thyroid nodule 10/05/2016  . Vitamin D deficiency 10/05/2016    Past Medical History:  Diagnosis Date  . Abnormal uterine bleeding 03/06/2019  . Anxiety disorder 07/24/2017   Alprazolam 15 pills. for 3 months or more.  . Chronic left-sided low back pain with sciatica 12/05/2017  . Class 1 obesity due to excess calories without serious comorbidity with body mass index (BMI) of 31.0 to 31.9 in adult 07/24/2017  . Degenerative lumbar disc 12/06/2017  . Depression 04/04/2018   -History of depression for which she prior was on Wellbutrin and Xanax PRN -Wellbutrin was started to help her with both depression as well as smoking cessation but she stopped this a while back -Stopped PRN xanax once she conceived -EPDS 1 today -Offered referral to perinatal psych for ongoing management but she declines.  Last Assessment & Plan:  Doing well today, mood reported as good.  . Encounter for procreative genetic counseling 05/02/2018   Genetic counseling visit on 05/09/18  Aneuploidy screening/ testing:  cfDNA ([employer] Non-invasive Prenatal Screening); results negative: no  aneuploidy detected. Fetal fraction 10%.  Carrier screening:  [x]  Cystic fibrosis -  negative x 60 mutations [x]  Spinal muscular atrophy - analysis failed. Notified patient. Declines redraw. [x]  Hemoglobinopathy screening - normal adult hemoglobin  [x]  Karyot  . Encounter for supervision of elderly multigravida in first trimester, antepartum 04/04/2018   Dating: L/7  MOD: RCS at 39 weeks [ ]  schedule Baby name: [ ]    FWB/Screening:  [-] Carrier screening: CF/SMA/Hgb EP - discussed 11/21, declined > saw GC and opted for testing. CF/Hgb electrophoresis normal. SMA was unable to be completed (unclear reason; sample failed post amplification quality requirements). [-] Patient notified by [facility] and declines redraw [x]  Aneuploidy screening - NIPT neg [x]  A  . Essential hypertension 12/09/2018  . Galactorrhea not associated with childbirth 03/06/2019  . Glucose intolerance of pregnancy 08/11/2018   3/30: 1 hr GTT 162 > [x]  3 hr GTT - 102/189/126/104. Fasting draw, however, was obtained 10/15 after administration of glucose and thus likely abnormally elevated. Would say patient passed test with only one abnormal level. Message out to nursing staff to notify patient, roffer nutrition, 08/19/18 [ ]   . History of MTHFR mutation 04/04/2018   -Identified during work-up for RPL at [facility].  -Already on PNV w/ folic acid supplementation.  -No need for further intervention  Last Assessment & Plan:  -Identified during work-up for RPL at [facility].  -Already on PNV w/ folic acid supplementation.  -No need for further intervention  . Lentigo 12/05/2017  . Localized swelling, mass, or lump of lower extremity, bilateral 10/27/2018   Postpartum admission for orthopnea and LE swelling, BNP 990. Echo normal, no signs of heart failure. Patient remained HDS through hospital stay and discharge after diuresis and improved symptoms on 10/27/2018  . Malaise and fatigue 07/24/2017  . MVP (mitral valve prolapse) 07/24/2017  . Obstructive sleep apnea syndrome 07/24/2017   HST 09/02/17 AHI 5.7  . Other idiopathic scoliosis,  lumbar region 12/06/2017  . Pain in pelvis 03/06/2019  . Pregnancy-induced hypertension with pulmonary edema 11/01/2018  . Primary osteoarthritis of both hips 12/06/2017   Seen on xrays 11/2017  . 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 Ruptured cyst of ovary 03/06/2019  . Tachycardia 07/24/2017  . Thyroid nodule 10/05/2016   Reported okay.  Would like updated  . Urinary tract infectious disease 03/06/2019  . Vitamin D deficiency 10/05/2016    Family History  Problem Relation Age of Onset  . Hypertension Mother   . Liver disease Mother   . Coronary artery disease Mother   . Hyperlipidemia Father   . Hypertension Father   . Coronary artery disease Father   . Rheum arthritis Sister   . Scoliosis Sister   . Aneurysm Maternal Grandfather   . Heart attack Maternal Grandfather   . Healthy Daughter   . Healthy Daughter   . Healthy Daughter   . Asthma Daughter   . Healthy Daughter    Past Surgical History:  Procedure Laterality Date  .  CESAREAN SECTION     05/24/06, 12/10/02, 10/21/2018  . DILATION AND EVACUATION N/A 12/08/2014   Procedure: DILATATION AND EVACUATION;  Surgeon: Vanessa Kick, MD;  Location: [facility];  Service: Gynecology;  Laterality: N/A;  . PARATHYROIDECTOMY    . tubal reversal  2016   Social History   Social History Narrative  . Not on file    There is no immunization history on file for this patient.   Objective: Vital Signs: There were no vitals taken for this visit.   Physical Exam   Musculoskeletal Exam: ***  CDAI  Exam: CDAI Score: -- Patient Global: --; Provider Global: -- Swollen: --; Tender: -- Joint Exam 03/07/2020   No joint exam has been documented for this visit   There is currently no information documented on the homunculus. Go to the Rheumatology activity and complete the homunculus joint exam.  Investigation: No additional findings.  Imaging: XR HIPS BILAT W OR W/O PELVIS 3-4 VIEWS  Result Date: 02/12/2020 No SI joint or hip joint narrowing was noted.  No chondrocalcinosis was noted. Impression: Unremarkable x-ray of the hip joints.  XR Foot 2 Views Left  Result Date: 02/12/2020 PIP and DIP narrowing was noted.  No MTP, intertarsal, tibiotalar joint space narrowing was noted.  No subtalar narrowing was noted.  Inferior calcaneal spur was noted. Impression: These findings are consistent with early osteoarthritic changes.  XR Foot 2 Views Right  Result Date: 02/12/2020 First MTP, PIP and DIP narrowing was noted.  No intertarsal, tibiotalar or subtalar joint space narrowing was noted.  No erosive changes were noted. Impression: These findings are consistent with mild osteoarthritis.  XR Hand 2 View Left  Result Date: 02/12/2020 No MCP, PIP or DIP narrowing was noted.  No intercarpal or radiocarpal joint space narrowing was noted.  No erosive changes were noted. Impression: Unremarkable x-ray of the hand.  XR Hand 2 View Right  Result Date: 02/12/2020 No MCP, PIP or DIP narrowing was noted.  No intercarpal or radiocarpal joint space narrowing was noted.  No erosive changes were noted. Impression: Unremarkable x-ray of the hand.   Recent Labs: Lab Results  Component Value Date   WBC 5.6 02/12/2020   HGB 12.9 02/12/2020   PLT 210 02/12/2020   NA 139 02/12/2020   K 4.4 02/12/2020   CL 106 02/12/2020   CO2 28 02/12/2020   GLUCOSE 87 02/12/2020   BUN 11 02/12/2020   CREATININE 0.87 02/12/2020   BILITOT 0.4 02/12/2020   AST 18 02/12/2020   ALT 23 02/12/2020   PROT 6.6  02/12/2020   CALCIUM 9.1 02/12/2020   GFRAA 96 02/12/2020  February 12, 2020 CK 92, sed rate 9, 14 3 3  eta negative  Speciality Comments: No specialty comments available.  Procedures:  No procedures performed Allergies: Patient has no known allergies.   Assessment / Plan:     Visit Diagnoses: No diagnosis found.  Orders: No orders of the defined types were placed in this encounter.  No orders of the defined types were placed in this encounter.   Face-to-face time spent with patient was *** minutes. Greater than 50% of time was spent in counseling and coordination of care.  Follow-Up Instructions: No follow-ups on file.   Bo Merino, MD  Note - This record has been created using Editor, commissioning.  Chart creation errors have been sought, but may not always  have been located. Such creation errors do not reflect on  the standard of medical care.

## 2020-02-26 LAB — CBC WITH DIFFERENTIAL/PLATELET
Absolute Monocytes: 241 cells/uL (ref 200–950)
Basophils Absolute: 28 cells/uL (ref 0–200)
Basophils Relative: 0.5 %
Eosinophils Absolute: 50 cells/uL (ref 15–500)
Eosinophils Relative: 0.9 %
HCT: 41.2 % (ref 35.0–45.0)
Hemoglobin: 12.9 g/dL (ref 11.7–15.5)
Lymphs Abs: 1568 cells/uL (ref 850–3900)
MCH: 28.1 pg (ref 27.0–33.0)
MCHC: 31.3 g/dL — ABNORMAL LOW (ref 32.0–36.0)
MCV: 89.8 fL (ref 80.0–100.0)
MPV: 12.2 fL (ref 7.5–12.5)
Monocytes Relative: 4.3 %
Neutro Abs: 3713 cells/uL (ref 1500–7800)
Neutrophils Relative %: 66.3 %
Platelets: 210 10*3/uL (ref 140–400)
RBC: 4.59 10*6/uL (ref 3.80–5.10)
RDW: 12.7 % (ref 11.0–15.0)
Total Lymphocyte: 28 %
WBC: 5.6 10*3/uL (ref 3.8–10.8)

## 2020-02-26 LAB — COMPLETE METABOLIC PANEL WITH GFR
AG Ratio: 1.5 (calc) (ref 1.0–2.5)
ALT: 23 U/L (ref 6–29)
AST: 18 U/L (ref 10–30)
Albumin: 4 g/dL (ref 3.6–5.1)
Alkaline phosphatase (APISO): 97 U/L (ref 31–125)
BUN: 11 mg/dL (ref 7–25)
CO2: 28 mmol/L (ref 20–32)
Calcium: 9.1 mg/dL (ref 8.6–10.2)
Chloride: 106 mmol/L (ref 98–110)
Creat: 0.87 mg/dL (ref 0.50–1.10)
GFR, Est African American: 96 mL/min/{1.73_m2} (ref >=60)
GFR, Est Non African American: 83 mL/min/{1.73_m2} (ref >=60)
Globulin: 2.6 g/dL (calc) (ref 1.9–3.7)
Glucose, Bld: 87 mg/dL (ref 65–99)
Potassium: 4.4 mmol/L (ref 3.5–5.3)
Sodium: 139 mmol/L (ref 135–146)
Total Bilirubin: 0.4 mg/dL (ref 0.2–1.2)
Total Protein: 6.6 g/dL (ref 6.1–8.1)

## 2020-02-26 LAB — 14-3-3 ETA PROTEIN: 14-3-3 eta Protein: <0.2 ng/mL (ref <0.2)

## 2020-02-26 LAB — CK: Total CK: 92 U/L (ref 29–143)

## 2020-02-26 LAB — SEDIMENTATION RATE: Sed Rate: 9 mm/h (ref 0–20)

## 2020-02-27 NOTE — Progress Notes (Signed)
Labs are within normal limits.  I will discuss results at the follow-up visit.

## 2020-03-02 ENCOUNTER — Other Ambulatory Visit: Payer: Medicaid Other | Admitting: Rheumatology

## 2020-03-07 ENCOUNTER — Ambulatory Visit: Payer: Medicaid Other | Admitting: Rheumatology

## 2020-03-07 DIAGNOSIS — G4733 Obstructive sleep apnea (adult) (pediatric): Secondary | ICD-10-CM

## 2020-03-07 DIAGNOSIS — O903 Peripartum cardiomyopathy: Secondary | ICD-10-CM

## 2020-03-07 DIAGNOSIS — Z8659 Personal history of other mental and behavioral disorders: Secondary | ICD-10-CM

## 2020-03-07 DIAGNOSIS — E041 Nontoxic single thyroid nodule: Secondary | ICD-10-CM

## 2020-03-07 DIAGNOSIS — E559 Vitamin D deficiency, unspecified: Secondary | ICD-10-CM

## 2020-03-07 DIAGNOSIS — I5032 Chronic diastolic (congestive) heart failure: Secondary | ICD-10-CM

## 2020-03-07 DIAGNOSIS — M19071 Primary osteoarthritis, right ankle and foot: Secondary | ICD-10-CM

## 2020-03-07 DIAGNOSIS — M19072 Primary osteoarthritis, left ankle and foot: Secondary | ICD-10-CM

## 2020-03-07 DIAGNOSIS — Q667 Congenital pes cavus, unspecified foot: Secondary | ICD-10-CM

## 2020-03-07 DIAGNOSIS — Z8261 Family history of arthritis: Secondary | ICD-10-CM

## 2020-03-07 DIAGNOSIS — M4126 Other idiopathic scoliosis, lumbar region: Secondary | ICD-10-CM

## 2020-03-07 DIAGNOSIS — I10 Essential (primary) hypertension: Secondary | ICD-10-CM

## 2020-03-07 DIAGNOSIS — I341 Nonrheumatic mitral (valve) prolapse: Secondary | ICD-10-CM

## 2020-03-07 DIAGNOSIS — E782 Mixed hyperlipidemia: Secondary | ICD-10-CM

## 2020-03-07 DIAGNOSIS — M5136 Other intervertebral disc degeneration, lumbar region: Secondary | ICD-10-CM

## 2020-03-07 DIAGNOSIS — M7061 Trochanteric bursitis, right hip: Secondary | ICD-10-CM

## 2020-03-07 DIAGNOSIS — M79641 Pain in right hand: Secondary | ICD-10-CM

## 2020-03-07 DIAGNOSIS — E538 Deficiency of other specified B group vitamins: Secondary | ICD-10-CM

## 2020-03-07 DIAGNOSIS — M2550 Pain in unspecified joint: Secondary | ICD-10-CM

## 2020-06-02 ENCOUNTER — Other Ambulatory Visit: Payer: Self-pay | Admitting: Cardiology

## 2020-06-02 NOTE — Telephone Encounter (Signed)
Rx refill sent to pharmacy. 

## 2020-09-07 ENCOUNTER — Encounter: Payer: Self-pay | Admitting: Cardiology

## 2020-09-07 ENCOUNTER — Ambulatory Visit (INDEPENDENT_AMBULATORY_CARE_PROVIDER_SITE_OTHER): Payer: Medicaid Other | Admitting: Cardiology

## 2020-09-07 ENCOUNTER — Other Ambulatory Visit: Payer: Self-pay

## 2020-09-07 VITALS — BP 110/80 | HR 72 | Ht 63.0 in | Wt 219.0 lb

## 2020-09-07 DIAGNOSIS — E782 Mixed hyperlipidemia: Secondary | ICD-10-CM

## 2020-09-07 DIAGNOSIS — R0609 Other forms of dyspnea: Secondary | ICD-10-CM

## 2020-09-07 DIAGNOSIS — I493 Ventricular premature depolarization: Secondary | ICD-10-CM

## 2020-09-07 DIAGNOSIS — I10 Essential (primary) hypertension: Secondary | ICD-10-CM | POA: Diagnosis not present

## 2020-09-07 DIAGNOSIS — E669 Obesity, unspecified: Secondary | ICD-10-CM

## 2020-09-07 DIAGNOSIS — I5032 Chronic diastolic (congestive) heart failure: Secondary | ICD-10-CM

## 2020-09-07 DIAGNOSIS — I471 Supraventricular tachycardia: Secondary | ICD-10-CM

## 2020-09-07 DIAGNOSIS — R0600 Dyspnea, unspecified: Secondary | ICD-10-CM

## 2020-09-07 DIAGNOSIS — I4719 Other supraventricular tachycardia: Secondary | ICD-10-CM

## 2020-09-07 DIAGNOSIS — R0789 Other chest pain: Secondary | ICD-10-CM

## 2020-09-07 HISTORY — DX: Obesity, unspecified: E66.9

## 2020-09-07 MED ORDER — NITROGLYCERIN 0.4 MG SL SUBL: 0.4000 mg | SUBLINGUAL_TABLET | SUBLINGUAL | 3 refills | Status: DC | PRN

## 2020-09-07 MED ORDER — METOPROLOL TARTRATE 50 MG PO TABS: ORAL_TABLET | ORAL | 0 refills | Status: DC

## 2020-09-07 NOTE — Patient Instructions (Signed)
Medication Instructions:  Your physician has recommended you make the following change in your medication: START: Nitroglycerin 0.4 mg take one tablet by mouth every 5 minutes up to three times as needed for chest pain.  *If you need a refill on your cardiac medications before your next appointment, please call your pharmacy*   Lab Work: Your physician recommends that you return for lab work: 3-7 days before CT: BMET, Mag If you have labs (blood work) drawn today and your tests are completely normal, you will receive your results only by: Marland Kitchen MyChart Message (if you have MyChart) OR . A paper copy in the mail If you have any lab test that is abnormal or we need to change your treatment, we will call you to review the results.   Testing/Procedures: Your cardiac CT will be scheduled at:  [facility] [address] [place], [place] [address] [phone]   If scheduled at [facility], please arrive at the [facility] main entrance (entrance A) of [facility] 30 minutes prior to test start time. Proceed to the [facility] Radiology Department (first floor) to check-in and test prep.  Please follow these instructions carefully (unless otherwise directed):   On the Night Before the Test: . Be sure to Drink plenty of water. . Do not consume any caffeinated/decaffeinated beverages or chocolate 12 hours prior to your test. . Do not take any antihistamines 12 hours prior to your test.   On the Day of the Test: . Drink plenty of water until 1 hour prior to the test. . Do not eat any food 4 hours prior to the test. . You may take your regular medications prior to the test.  . Take metoprolol (Lopressor) two hours prior to test. . HOLD Furosemide morning of the test. . FEMALES- please wear underwire-free bra if available       After the Test: . Drink plenty of water. . After receiving IV contrast, you may experience a mild flushed feeling. This is  normal. . On occasion, you may experience a mild rash up to 24 hours after the test. This is not dangerous. If this occurs, you can take Benadryl 25 mg and increase your fluid intake. . If you experience trouble breathing, this can be serious. If it is severe call 911 IMMEDIATELY. If it is mild, please call our office. . If you take any of these medications: Glipizide/Metformin, Avandament, Glucavance, please do not take 48 hours after completing test unless otherwise instructed.   Once we have confirmed authorization from your insurance company, we will call you to set up a date and time for your test. Based on how quickly your insurance processes prior authorizations requests, please allow up to 4 weeks to be contacted for scheduling your Cardiac CT appointment. Be advised that routine Cardiac CT appointments could be scheduled as many as 8 weeks after your provider has ordered it.  For non-scheduling related questions, please contact the cardiac imaging nurse navigator should you have any questions/concerns: Marchia Bond, Cardiac Imaging Nurse Navigator Gordy Clement, Cardiac Imaging Nurse Navigator [facility] Direct Office Dial: [phone]   For scheduling needs, including cancellations and rescheduling, please call [place], [phone].     Follow-Up: At [facility], you and your health needs are our priority.  As part of our continuing mission to provide you with exceptional heart care, we have created designated Provider Care Teams.  These Care Teams include your primary Cardiologist (physician)  and Advanced Practice Providers (APPs -  Physician Assistants and Nurse Practitioners) who all work together to provide you with the care you need, when you need it.  We recommend signing up for the patient portal called "MyChart".  Sign up information is provided on this After Visit Summary.  MyChart is used to connect with patients for Virtual Visits  (Telemedicine).  Patients are able to view lab/test results, encounter notes, upcoming appointments, etc.  Non-urgent messages can be sent to your provider as well.   To learn more about what you can do with MyChart, go to [email or web address].    Your next appointment:   3 month(s)  The format for your next appointment:   In Person  Provider:   Berniece Salines, DO   Other Instructions

## 2020-09-07 NOTE — Progress Notes (Signed)
Cardiology Office Note:    Date:  09/07/2020   ID:  Claire Sharp, DOB 11-13-1980, MRN [id number]  PCP:  Raina Mina., MD  Cardiologist:  Berniece Salines, DO  Electrophysiologist:  None   Referring MD: Raina Mina., MD   I am have been,   History of Present Illness:    Claire Sharp is a 40 y.o. female with a hx of hypertension, postpartum cardiomyopathy, obesity, obstructive sleep apnea, presented initially on October 23 to be evaluated for chest pain, palpitations and worsening shortness of breath. At that time a ZIO monitor, his coronary CT as well as a transthoracic echocardiogram were ordered. At that time she was hypertensive therefore placed patient on hydralazine to help with her blood pressure.  I saw the patient on April 29, 2019 after getting her testing the patient presented to discuss her testing as well as reported that she was experiencing significant headaches with her hydralazine. Her ZIO monitor did show evidence of symptomatic PVCs, CCTA with 0 calcium and transthoracic echocardiogram with a normal LVEF. At that time her blood pressure was elevated but not at goal. At the conclusion of her visit I stop the nifedipine as well as the hydralazine. Start the patient on diltiazem 240 daily to help with her symptomatic control of her PVC as well as her hypertension.  Did see the patient on June 02, 2019 at that time she appears to be doing well from a cardiovascular standpoint. At her last visit in April 2021 the patient was at her baseline doing well .  Today she is here for follow-up visit.  She tells me that for the last 3 or more weeks she has been experiencing intermittent chest pain as well as shortness of breath.   Past Medical History:  Diagnosis Date  . Abnormal uterine bleeding 03/06/2019  . Anxiety disorder 07/24/2017   Alprazolam 15 pills. for 3 months or more.  . Chronic left-sided low back pain with sciatica 12/05/2017  . Class 1 obesity due to excess  calories without serious comorbidity with body mass index (BMI) of 31.0 to 31.9 in adult 07/24/2017  . Degenerative lumbar disc 12/06/2017  . Depression 04/04/2018   -History of depression for which she prior was on Wellbutrin and Xanax PRN -Wellbutrin was started to help her with both depression as well as smoking cessation but she stopped this a while back -Stopped PRN xanax once she conceived -EPDS 1 today -Offered referral to perinatal psych for ongoing management but she declines.  Last Assessment & Plan:  Doing well today, mood reported as good.  . Encounter for procreative genetic counseling 05/02/2018   Genetic counseling visit on 05/09/18  Aneuploidy screening/ testing:  cfDNA ([employer] Non-invasive Prenatal Screening); results negative: no aneuploidy detected. Fetal fraction 10%.  Carrier screening:  [x]  Cystic fibrosis - negative x 60 mutations [x]  Spinal muscular atrophy - analysis failed. Notified patient. Declines redraw. [x]  Hemoglobinopathy screening - normal adult hemoglobin  [x]  Karyot  . Encounter for supervision of elderly multigravida in first trimester, antepartum 04/04/2018   Dating: L/7  MOD: RCS at 39 weeks [ ]  schedule Baby name: [ ]    FWB/Screening:  [-] Carrier screening: CF/SMA/Hgb EP - discussed 11/21, declined > saw GC and opted for testing. CF/Hgb electrophoresis normal. SMA was unable to be completed (unclear reason; sample failed post amplification quality requirements). [-] Patient notified by [facility] and declines redraw [x]  Aneuploidy screening - NIPT neg [x]  A  . Essential hypertension 12/09/2018  . Galactorrhea  not associated with childbirth 03/06/2019  . Glucose intolerance of pregnancy 08/11/2018   3/30: 1 hr GTT 162 > [x]  3 hr GTT - 102/189/126/104. Fasting draw, however, was obtained 10/15 after administration of glucose and thus likely abnormally elevated. Would say patient passed test with only one abnormal level. Message out to nursing staff to notify patient, roffer  nutrition, 08/19/18 [ ]   . History of MTHFR mutation 04/04/2018   -Identified during work-up for RPL at [facility].  -Already on PNV w/ folic acid supplementation.  -No need for further intervention  Last Assessment & Plan:  -Identified during work-up for RPL at [facility].  -Already on PNV w/ folic acid supplementation.  -No need for further intervention  . Lentigo 12/05/2017  . Localized swelling, mass, or lump of lower extremity, bilateral 10/27/2018   Postpartum admission for orthopnea and LE swelling, BNP 990. Echo normal, no signs of heart failure. Patient remained HDS through hospital stay and discharge after diuresis and improved symptoms on 10/27/2018  . Malaise and fatigue 07/24/2017  . MVP (mitral valve prolapse) 07/24/2017  . Obstructive sleep apnea syndrome 07/24/2017   HST 09/02/17 AHI 5.7  . Other idiopathic scoliosis, lumbar region 12/06/2017  . Pain in pelvis 03/06/2019  . Pregnancy-induced hypertension with pulmonary edema 11/01/2018  . Primary osteoarthritis of both hips 12/06/2017   Seen on xrays 11/2017  . 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 Ruptured cyst of ovary 03/06/2019  . Tachycardia 07/24/2017  . Thyroid nodule 10/05/2016   Reported okay.  Would like updated  . Urinary tract infectious disease 03/06/2019  . Vitamin D deficiency 10/05/2016    Past Surgical History:  Procedure Laterality Date  . CESAREAN SECTION     05/24/06, 12/10/02, 10/21/2018  . DILATION AND EVACUATION N/A 12/08/2014   Procedure: DILATATION AND EVACUATION;   Surgeon: Vanessa Kick, MD;  Location: [facility];  Service: Gynecology;  Laterality: N/A;  . PARATHYROIDECTOMY    . tubal reversal  2016    Current Medications: Current Meds  Medication Sig  . ALPRAZolam (XANAX) 0.5 MG tablet Take 0.5 mg by mouth at bedtime as needed.  Marland Kitchen atorvastatin (LIPITOR) 20 MG tablet TAKE 1 TABLET BY MOUTH EVERY DAY  . diltiazem (CARDIZEM CD) 240 MG 24 hr capsule TAKE 1 CAPSULE BY MOUTH EVERY DAY  . escitalopram (LEXAPRO) 10 MG tablet Take 10 mg by mouth daily.  . furosemide (LASIX) 20 MG tablet Take 40 mg (2 tabs) in the AM and 20 mg (1 tab) in the PM     Allergies:   Patient has no known allergies.   Social History   Socioeconomic History  . Marital status: Married    Spouse name: Not on file  . Number of children: Not on file  . Years of education: Not on file  . Highest education level: Not on file  Occupational History  . Not on file  Tobacco Use  . Smoking status: Former Smoker    Quit date: 03/21/2018  Years since quitting: 2.4  . Smokeless tobacco: Never Used  Vaping Use  . Vaping Use: Never used  Substance and Sexual Activity  . Alcohol use: Not Currently  . Drug use: Never  . Sexual activity: Not on file  Other Topics Concern  . Not on file  Social History Narrative  . Not on file   Social Determinants of Health   Financial Resource Strain: Not on file  Food Insecurity: Not on file  Transportation Needs: Not on file  Physical Activity: Not on file  Stress: Not on file  Social Connections: Not on file     Family History: The patient's family history includes Aneurysm in her maternal grandfather; Asthma in her daughter; Coronary artery disease in her father and mother; Healthy in her daughter, daughter, daughter, and daughter; Heart attack in her maternal grandfather; Hyperlipidemia in her father; Hypertension in her father and mother; Liver disease in her mother; Rheum arthritis in her sister; Scoliosis in her sister.  ROS:   Review  of Systems  Constitution: Negative for decreased appetite, fever and weight gain.  HENT: Negative for congestion, ear discharge, hoarse voice and sore throat.   Eyes: Negative for discharge, redness, vision loss in right eye and visual halos.  Cardiovascular: Negative for chest pain, dyspnea on exertion, leg swelling, orthopnea and palpitations.  Respiratory: Negative for cough, hemoptysis, shortness of breath and snoring.   Endocrine: Negative for heat intolerance and polyphagia.  Hematologic/Lymphatic: Negative for bleeding problem. Does not bruise/bleed easily.  Skin: Negative for flushing, nail changes, rash and suspicious lesions.  Musculoskeletal: Negative for arthritis, joint pain, muscle cramps, myalgias, neck pain and stiffness.  Gastrointestinal: Negative for abdominal pain, bowel incontinence, diarrhea and excessive appetite.  Genitourinary: Negative for decreased libido, genital sores and incomplete emptying.  Neurological: Negative for brief paralysis, focal weakness, headaches and loss of balance.  Psychiatric/Behavioral: Negative for altered mental status, depression and suicidal ideas.  Allergic/Immunologic: Negative for HIV exposure and persistent infections.    EKGs/Labs/Other Studies Reviewed:    The following studies were reviewed today:   EKG:  The ekg ordered today demonstrates sinus rhythm, heart rate 70 bpm with no ST segment changes.  Recent Labs: 02/12/2020: ALT 23; BUN 11; Creat 0.87; Hemoglobin 12.9; Platelets 210; Potassium 4.4; Sodium 139  Recent Lipid Panel    Component Value Date/Time   CHOL 289 (H) 03/06/2019 0941   TRIG 130 03/06/2019 0941   HDL 92 03/06/2019 0941   CHOLHDL 3.1 03/06/2019 0941   LDLCALC 175 (H) 03/06/2019 0941    Physical Exam:    VS:  BP 110/80   Pulse 72   Ht 5\' 3"  (1.6 m)   Wt 219 lb (99.3 kg)   SpO2 98%   BMI 38.79 kg/m     Wt Readings from Last 3 Encounters:  09/07/20 219 lb (99.3 kg)  02/12/20 217 lb (98.4 kg)   09/02/19 201 lb (91.2 kg)     GEN: Well nourished, well developed in no acute distress HEENT: Normal NECK: No JVD; No carotid bruits LYMPHATICS: No lymphadenopathy CARDIAC: S1S2 noted,RRR, no murmurs, rubs, gallops RESPIRATORY:  Clear to auscultation without rales, wheezing or rhonchi  ABDOMEN: Soft, non-tender, non-distended, +bowel sounds, no guarding. EXTREMITIES: No edema, No cyanosis, no clubbing MUSCULOSKELETAL:  No deformity  SKIN: Warm and dry NEUROLOGIC:  Alert and oriented x 3, non-focal PSYCHIATRIC:  Normal affect, good insight  ASSESSMENT:    1. Essential hypertension   2. Chronic heart failure with preserved ejection fraction (  HCC)   3. PAT (paroxysmal atrial tachycardia) (HCC)   4. Symptomatic PVCs   5. Mixed hyperlipidemia   6. Obesity (BMI 30-39.9)    PLAN:     Her chest pain is concerning given her risk factors hyperlipidemia, premature family history of coronary artery disease as well as obesity, at this time it will be appropriate to move on with understanding the nature of her chest pain.  In this patient coronary CTA will be an appropriate testing.  She has no IV contrast dye allergy.  She is agreeable to proceed with this.  Sublingual nitroglycerin prescription was sent, its protocol and 911 protocol explained and the patient vocalized understanding questions were answered to the patient's satisfaction.  In terms of her PVCs she does not feel symptoms of palpitations and is still happy with being on her Cardizem.  We will continue this for now.  Her blood pressure deceptively in the office today no changes will be made.  The patient understands the need to lose weight with diet and exercise. We have discussed specific strategies for this.  The patient is in agreement with the above plan. The patient left the office in stable condition.  The patient will follow up in 3 months or sooner if needed.   Medication Adjustments/Labs and Tests Ordered: Current  medicines are reviewed at length with the patient today.  Concerns regarding medicines are outlined above.  No orders of the defined types were placed in this encounter.  No orders of the defined types were placed in this encounter.   There are no Patient Instructions on file for this visit.   Adopting a Healthy Lifestyle.  Know what a healthy weight is for you (roughly BMI <25) and aim to maintain this   Aim for 7+ servings of fruits and vegetables daily   65-80+ fluid ounces of water or unsweet tea for healthy kidneys   Limit to max 1 drink of alcohol per day; avoid smoking/tobacco   Limit animal fats in diet for cholesterol and heart health - choose grass fed whenever available   Avoid highly processed foods, and foods high in saturated/trans fats   Aim for low stress - take time to unwind and care for your mental health   Aim for 150 min of moderate intensity exercise weekly for heart health, and weights twice weekly for bone health   Aim for 7-9 hours of sleep daily   When it comes to diets, agreement about the perfect plan isnt easy to find, even among the experts. Experts at the [facility] [employer] developed an idea known as the Healthy Eating Plate. Just imagine a plate divided into logical, healthy portions.   The emphasis is on diet quality:   Load up on vegetables and fruits - one-half of your plate: Aim for color and variety, and remember that potatoes dont count.   Go for whole grains - one-quarter of your plate: Whole wheat, barley, wheat berries, quinoa, oats, brown rice, and foods made with them. If you want pasta, go with whole wheat pasta.   Protein power - one-quarter of your plate: Fish, chicken, beans, and nuts are all healthy, versatile protein sources. Limit red meat.   The diet, however, does go beyond the plate, offering a few other suggestions.   Use healthy plant oils, such as olive, canola, soy, corn, sunflower and peanut. Check the  labels, and avoid partially hydrogenated oil, which have unhealthy trans fats.   If youre thirsty, drink  water. Coffee and tea are good in moderation, but skip sugary drinks and limit milk and dairy products to one or two daily servings.   The type of carbohydrate in the diet is more important than the amount. Some sources of carbohydrates, such as vegetables, fruits, whole grains, and beans-are healthier than others.   Finally, stay active  Signed, Berniece Salines, DO  09/07/2020 8:45 AM    [place]

## 2020-09-15 ENCOUNTER — Telehealth (HOSPITAL_COMMUNITY): Payer: Self-pay | Admitting: *Deleted

## 2020-09-15 NOTE — Telephone Encounter (Signed)
Reaching out to patient to offer assistance regarding upcoming cardiac imaging study; pt verbalizes understanding of appt date/time, parking situation and where to check in, pre-test NPO status and medications ordered, and verified current allergies; name and call back number provided for further questions should they arise  Elmer Merwin RN Navigator Cardiac Imaging [facility] [phone] office [phone] cell  Pt to take 100mg metoprolol tartrate 2 hours prior to cardiac CT scan. 

## 2020-09-16 ENCOUNTER — Other Ambulatory Visit: Payer: Self-pay

## 2020-09-16 ENCOUNTER — Ambulatory Visit (HOSPITAL_COMMUNITY)
Admission: RE | Admit: 2020-09-16 | Discharge: 2020-09-16 | Disposition: A | Discharge status: Home / Self Care | Payer: Medicaid Other | Source: Ambulatory Visit | Attending: Cardiology | Admitting: Cardiology

## 2020-09-16 DIAGNOSIS — Z006 Encounter for examination for normal comparison and control in clinical research program: Secondary | ICD-10-CM

## 2020-09-16 DIAGNOSIS — R0789 Other chest pain: Secondary | ICD-10-CM | POA: Insufficient documentation

## 2020-09-16 MED ORDER — NITROGLYCERIN 0.4 MG SL SUBL
SUBLINGUAL_TABLET | SUBLINGUAL | Status: AC
  Filled 2020-09-16: qty 2

## 2020-09-16 MED ORDER — IOHEXOL 350 MG/ML SOLN
80.0000 mL | Freq: Once | INTRAVENOUS | Status: AC | PRN
  Administered 2020-09-16: 80 mL via INTRAVENOUS

## 2020-09-16 MED ORDER — NITROGLYCERIN 0.4 MG SL SUBL
0.8000 mg | SUBLINGUAL_TABLET | Freq: Once | SUBLINGUAL | Status: AC
  Administered 2020-09-16: 0.8 mg via SUBLINGUAL

## 2020-09-16 NOTE — Progress Notes (Signed)
CT scan completed. Tolerated well. D/C home ambulatory with husband. Awake and alert. In no distress. 

## 2020-09-16 NOTE — Research (Signed)
IDENTIFY Informed Consent                  Subject Name: Claire Sharp   Subject met inclusion and exclusion criteria.  The informed consent form, study requirements and expectations were reviewed with the subject and questions and concerns were addressed prior to the signing of the consent form.  The subject verbalized understanding of the trial requirements.  The subject agreed to participate in the IDENTIFY trial and signed the informed consent at 10:00AM on 09/16/20.  The informed consent was obtained prior to performance of any protocol-specific procedures for the subject.  A copy of the signed informed consent was given to the subject and a copy was placed in the subject's medical record.   Meade Maw , Naval architect

## 2020-12-15 ENCOUNTER — Telehealth: Payer: Self-pay | Admitting: *Deleted

## 2020-12-15 DIAGNOSIS — Z006 Encounter for examination for normal comparison and control in clinical research program: Secondary | ICD-10-CM

## 2020-12-15 NOTE — Telephone Encounter (Signed)
I called patient for 90-day Identify study phone call. Patient is still having the same symptoms of chest pain and shortness of breath.Patient is on Cardizem for these symptoms and the medication has improved the symptoms. I will call patient in May 2023 for 1-year follow-up.

## 2020-12-21 ENCOUNTER — Other Ambulatory Visit: Payer: Self-pay

## 2020-12-21 ENCOUNTER — Ambulatory Visit: Payer: Medicaid Other | Admitting: Cardiology

## 2021-06-20 IMAGING — CT CT HEART MORP W/ CTA COR W/ SCORE W/ CA W/CM &/OR W/O CM
4 of 7 series · 8 of 20 positions shown, 9 images · IV contrast (APPLIED)
Comparison: Cardiac CT 03/31/2019.
COMPARISON: Cardiac CT 03/31/2019.

Addendum:
EXAM:
OVER-READ INTERPRETATION  CT CHEST

The following report is an over-read performed by radiologist Dr.
Nola Oxendine [REDACTED] on 09/16/2020. This
over-read does not include interpretation of cardiac or coronary
anatomy or pathology. The coronary calcium score/coronary CTA
interpretation by the cardiologist is attached.
TECHNIQUE: The patient was scanned on a Phillips Force scanner.

[Series 7: best diast 73 % · axial · 0.39mm/px · z∈[+1322,+1367]mm · 2 of 333 slices shown]
[im 111/333  vessel]
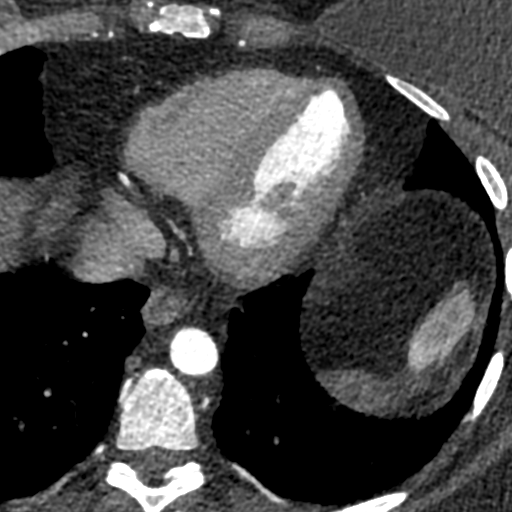
[im 222/333  vessel]
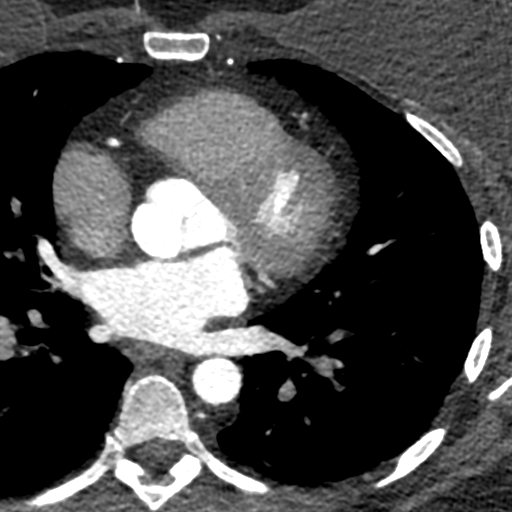

[Series 8: best syst · axial · 0.39mm/px · z∈[+1322,+1367]mm · 2 of 333 slices shown, 3 images]
[im 111/333  vessel]
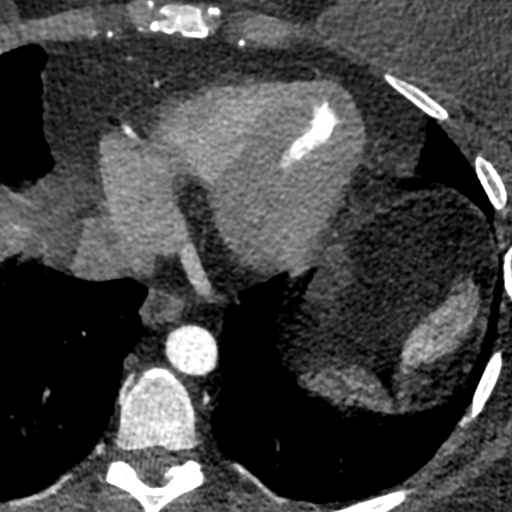
[im 111/333  lung]
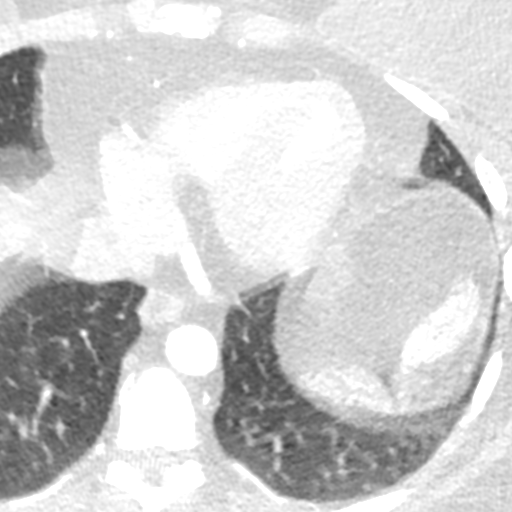
[im 222/333  vessel]
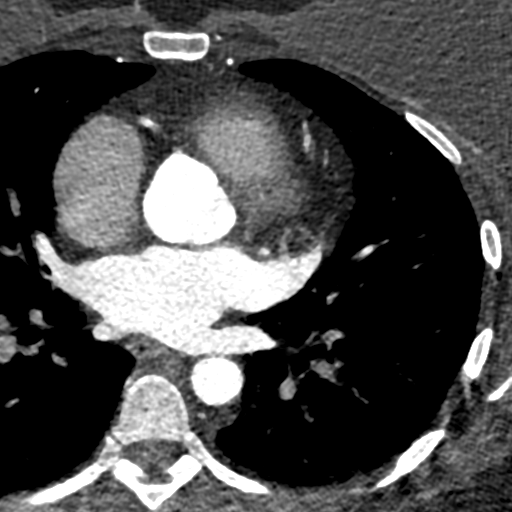

[Series 9: ts diast sharp 73 % · axial · 0.39mm/px · z∈[+1322,+1367]mm · 2 of 333 slices shown]
[im 111/333  lung]
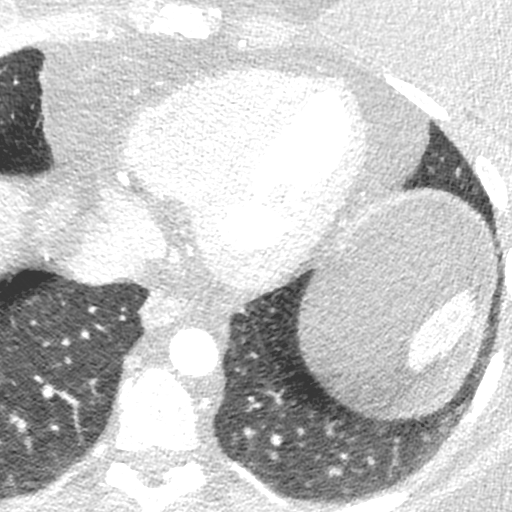
[im 222/333  lung]
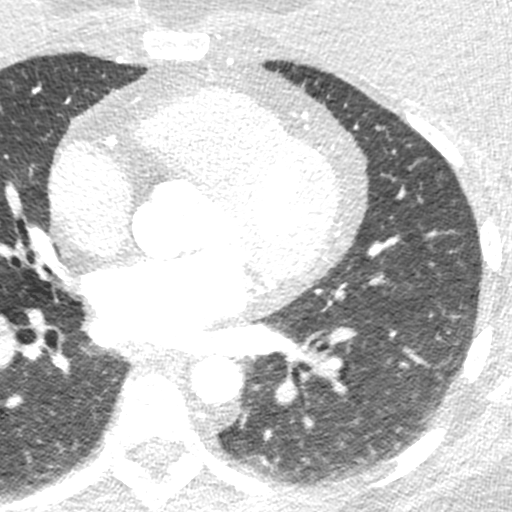

[Series 10: ts syst sharp 38 % · axial · 0.39mm/px · z∈[+1322,+1367]mm · 2 of 333 slices shown]
[im 111/333  lung]
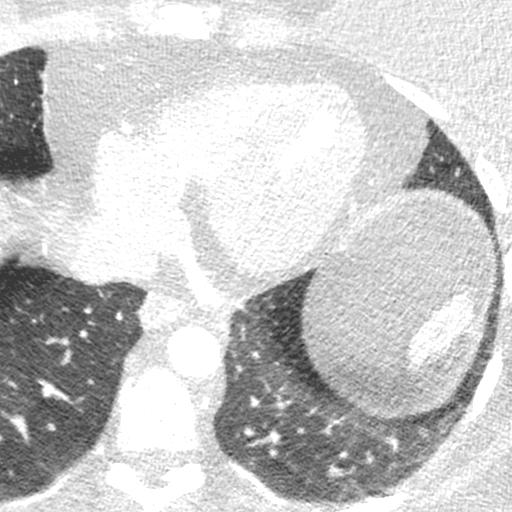
[im 222/333  lung]
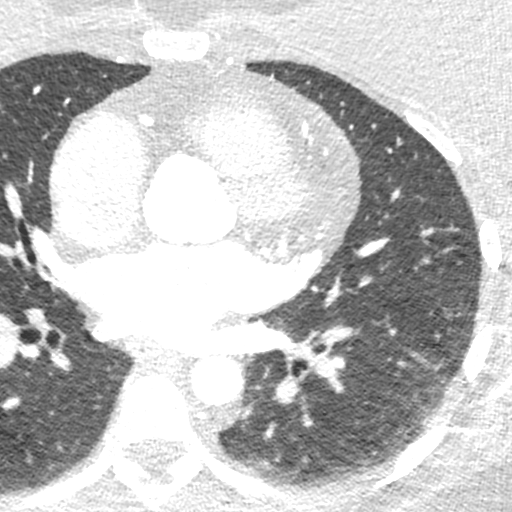

[8 of 20 positions shown; findings below may reference images not displayed]

FINDINGS: Within the visualized portions of the thorax there are no suspicious
appearing pulmonary nodules or masses, there is no acute
consolidative airspace disease, no pleural effusions, no
pneumothorax and no lymphadenopathy. Visualized portions of the
upper abdomen are unremarkable. There are no aggressive appearing
lytic or blastic lesions noted in the visualized portions of the
skeleton. Bilateral breast implants are incidentally noted.
IMPRESSION: 1. No significant incidental noncardiac findings are noted.

EXAM:
Cardiac/Coronary  CTA
FINDINGS: A 100 kV prospective scan was triggered in the descending thoracic
aorta at 111 HU's. Axial non-contrast 3 mm slices were carried out
through the heart. The data set was analyzed on a dedicated work
station and scored using the Agatson method. Gantry rotation speed
was 250 msecs and collimation was .6 mm. 0.8 mg of sl NTG was given.
The 3D data set was reconstructed in 5% intervals of the 67-82 % of
the R-R cycle. Diastolic phases were analyzed on a dedicated work
station using MPR, MIP and VRT modes. The patient received 80 cc of
contrast.

Aorta:  Normal size.  No calcifications.  No dissection.

Aortic Valve:  Trileaflet.  No calcifications.

Coronary Arteries:  Normal coronary origin.  Right dominance.

RCA is a large dominant artery that gives rise to PDA and PLA. There
is no plaque. Distal vessel is poorly filled.

Left main is a large artery that gives rise to LAD, small ramus and
LCX arteries.

LAD is a large vessel that has no plaque. The distal segment
demonstrates filling artifact.

LCX is a non-dominant artery that gives rise to one small caliber
OM1 branch. There is no plaque. Distal vessel is poorly filled.

Other findings:

Normal pulmonary vein drainage into the left atrium.

Normal left atrial appendage without a thrombus.

Normal size of the pulmonary artery.

Please see radiology report for non cardiac findings.
IMPRESSION: 1. Coronary calcium score of 0. This was 0 percentile for age and
sex matched control.

2. Normal coronary origin with right dominance.

3. No evidence of CAD in proximal and mid vessels.

*** End of Addendum ***
EXAM:
OVER-READ INTERPRETATION  CT CHEST

The following report is an over-read performed by radiologist Dr.
Nola Oxendine [REDACTED] on 09/16/2020. This
over-read does not include interpretation of cardiac or coronary
anatomy or pathology. The coronary calcium score/coronary CTA
interpretation by the cardiologist is attached.
FINDINGS: Within the visualized portions of the thorax there are no suspicious
appearing pulmonary nodules or masses, there is no acute
consolidative airspace disease, no pleural effusions, no
pneumothorax and no lymphadenopathy. Visualized portions of the
upper abdomen are unremarkable. There are no aggressive appearing
lytic or blastic lesions noted in the visualized portions of the
skeleton. Bilateral breast implants are incidentally noted.
IMPRESSION: 1. No significant incidental noncardiac findings are noted.

## 2022-06-21 NOTE — Progress Notes (Deleted)
Cardiology Clinic Note   Patient Name: Claire Sharp Date of Encounter: 06/21/2022  Primary Care Provider:  Raina Mina., MD Primary Cardiologist:  Berniece Salines, DO  Patient Profile    Claire Sharp 42 year old female presents the clinic today for follow-up evaluation of her essential hypertension and mitral valve prolapse.  Past Medical History    Past Medical History:  Diagnosis Date   Abnormal EKG 03/06/2019   Abnormal uterine bleeding 03/06/2019   Anxiety disorder 07/24/2017   Alprazolam 15 pills. for 3 months or more.   Chronic heart failure with preserved ejection fraction ([place]) 03/06/2019   Chronic left-sided low back pain with sciatica 12/05/2017   Class 1 obesity due to excess calories without serious comorbidity with body mass index (BMI) of 31.0 to 31.9 in adult 07/24/2017   Class 2 severe obesity due to excess calories with serious comorbidity and body mass index (BMI) of 37.0 to 37.9 in adult [facility]) 07/24/2017   Degenerative lumbar disc 12/06/2017   Depression 04/04/2018   -History of depression for which she prior was on Wellbutrin and Xanax PRN -Wellbutrin was started to help her with both depression as well as smoking cessation but she stopped this a while back -Stopped PRN xanax once she conceived -EPDS 1 today -Offered referral to perinatal psych for ongoing management but she declines.  Last Assessment & Plan:  Doing well today, mood reported as good.   Dyspnea 03/06/2019   Encounter for procreative genetic counseling 05/02/2018   Genetic counseling visit on 05/09/18  Aneuploidy screening/ testing:  cfDNA ([employer] Non-invasive Prenatal Screening); results negative: no aneuploidy detected. Fetal fraction 10%.  Carrier screening:  '[x]'$  Cystic fibrosis - negative x 60 mutations '[x]'$  Spinal muscular atrophy - analysis failed. Notified patient. Declines redraw. '[x]'$  Hemoglobinopathy screening - normal adult hemoglobin  '[x]'$  Karyot   Encounter for supervision of elderly  multigravida in first trimester, antepartum 04/04/2018   Dating: L/7  MOD: RCS at 39 weeks '[ ]'$  schedule Baby name: '[ ]'$    FWB/Screening:  [-] Carrier screening: CF/SMA/Hgb EP - discussed 11/21, declined > saw GC and opted for testing. CF/Hgb electrophoresis normal. SMA was unable to be completed (unclear reason; sample failed post amplification quality requirements). [-] Patient notified by [facility] and declines redraw '[x]'$  Aneuploidy screening - NIPT neg '[x]'$  A   Essential hypertension 12/09/2018   Former smoker 04/04/2018   -Reports smoking 1ppd x15 years but stopped immediately upon finding out she was pregnant and has abstained since. Has quit with her other pregnancies as well. -Commended her effort and offered support in the way of nicotine replacement therapy vs tobacco cessation hotline/referral to tobacco cessation clinic but patient declined -Reviewed health benefits of smoking cessation and encouraged her to   Galactorrhea not associated with childbirth 03/06/2019   Glucose intolerance of pregnancy 08/11/2018   3/30: 1 hr GTT 162 > '[x]'$  3 hr GTT - 102/189/126/104. Fasting draw, however, was obtained 10/15 after administration of glucose and thus likely abnormally elevated. Would say patient passed test with only one abnormal level. Message out to nursing staff to notify patient, roffer nutrition, 08/19/18 '[ ]'$    History of cesarean delivery 04/04/2018   partial  G2 Term, SVD  G3 Term, CS (pushed for 4.5hours, AODescent) -2004 G4 Term, ERCS > BTL - 2008 03/2014 tubal reversal  Op report from 2008 repeat CS scanned in 12/20 - no significant adhesive disease mentioned.   '[x]'$  Readdress TOLAC vs rCS at later visit. MFMU VBAC calculator =  61.1%.  Planning repeat cesarean section - scheduled for 39 weeks on 7/2  Last Assessment & Plan:  Scheduled for re   History of MTHFR mutation 04/04/2018   -Identified during work-up for RPL at [facility].  -Already on PNV w/ folic acid supplementation.  -No need for further  intervention  Last Assessment & Plan:  -Identified during work-up for RPL at [facility].  -Already on PNV w/ folic acid supplementation.  -No need for further intervention   Lentigo 12/05/2017   Localized swelling, mass, or lump of lower extremity, bilateral 10/27/2018   Postpartum admission for orthopnea and LE swelling, BNP 990. Echo normal, no signs of heart failure. 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   Pregnancy-induced hypertension with pulmonary edema 11/01/2018   Primary osteoarthritis of both hips 12/06/2017   Seen on xrays 11/2017   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  Would like updated   Tobacco dependence 07/24/2017   Urinary tract infectious disease 03/06/2019   Vitamin D deficiency 10/05/2016   Past Surgical History:  Procedure Laterality Date   CESAREAN SECTION     05/24/06, 12/10/02, 10/21/2018   DILATION  AND EVACUATION N/A 12/08/2014   Procedure: DILATATION AND EVACUATION;  Surgeon: KVanessa Kick MD;  [facility];  Service: Gynecology;  Laterality: N/A;   PARATHYROIDECTOMY     tubal reversal  2016    Allergies  No Known Allergies  History of Present Illness    LKhadeeja Eldenhas a PMH of HTN, postpartum cardiomyopathy, obesity, OSA,  paroxysmal atrial tachycardia, symptomatic PVCs, HLD, obesity, and DOE.  She was initially seen October 23 for evaluation of chest discomfort and palpitations as well as increasing shortness of breath.  She wore a cardiac event monitor, underwent coronary CT and TTE.  She was also noted to be hypertensive and placed on hydralazine.  She was seen in follow-up by Dr. THarriet Masson12/20.  During that time she noted significant headaches after hydralazine dosing.  Her cardiac event monitor showed symptomatic PVCs, coronary CTA showed a calcium score of 0 and her echocardiogram showed normal LVEF.  Her blood pressure continues to be elevated.  Her nifedipine and hydralazine were stopped.  She was started on diltiazem 240 mg daily.  She was seen in follow-up by Dr. THarriet Masson1/19/2021.  She was stable from a cardiac standpoint.  She followed up 4/21 and continued  to do well from a cardiac standpoint.  She was last seen by Dr. Harriet Masson on 09/07/2020.  During that time she reported intermittent chest discomfort x 3 weeks.  She also noted shortness of breath.  A coronary CTA was repeated and showed a coronary calcium score of 0.  She presents to the clinic today for follow-up evaluation and states***  *** denies chest pain, shortness of breath, lower extremity edema, fatigue, palpitations, melena, hematuria, hemoptysis, diaphoresis, weakness, presyncope, syncope, orthopnea, and PND.  Essential hypertension-BP today*** Continue*** Heart healthy low-sodium diet-salty 6 given Increase physical activity as tolerated  Chronic heart failure with preserved EF-no increased DOE or activity  intolerance.  NYHA class I.  Echocardiogram 10/20 showed normal EF and no significant abnormalities. Continue furosemide Heart healthy low-sodium diet-salty 6 given Increase physical activity as tolerated  Hyperlipidemia-LDL*** Continue atorvastatin Heart healthy low-sodium high-fiber diet Increase physical activity as tolerated  Paroxysmal atrial tachycardia-denies palpitations. Continue diltiazem Avoid triggers caffeine, chocolate, EtOH, dehydration etc.  Disposition: Follow-up with Dr. Harriet Masson in 12 months. Home Medications    Prior to Admission medications   Medication Sig Start Date End Date Taking? Authorizing Provider  ALPRAZolam Duanne Moron) 0.5 MG tablet Take 0.5 mg by mouth at bedtime as needed for anxiety. 12/09/18   [provider]  atorvastatin (LIPITOR) 20 MG tablet TAKE 1 TABLET BY MOUTH EVERY DAY 06/02/20   Tobb, Kardie, DO  diltiazem (CARDIZEM CD) 240 MG 24 hr capsule TAKE 1 CAPSULE BY MOUTH EVERY DAY 10/13/19   Tobb, Kardie, DO  escitalopram (LEXAPRO) 10 MG tablet Take 10 mg by mouth daily.    [provider]  furosemide (LASIX) 20 MG tablet Take 40 mg (2 tabs) in the AM and 20 mg (1 tab) in the PM 03/06/19   Tobb, Kardie, DO  nitroGLYCERIN (NITROSTAT) 0.4 MG SL tablet Place 0.4 mg under the tongue every 5 (five) minutes as needed for chest pain.    [provider]    Family History    Family History  Problem Relation Age of Onset   Hypertension Mother    Liver disease Mother    Coronary artery disease Mother    Hyperlipidemia Father    Hypertension Father    Coronary artery disease Father    Rheum arthritis Sister    Scoliosis Sister    Aneurysm Maternal Grandfather    Heart attack Maternal Grandfather    Healthy Daughter    Healthy Daughter    Healthy Daughter    Asthma Daughter    Healthy Daughter    She indicated that her mother is alive. She indicated that her father is alive. She indicated that her sister is alive. She indicated  that her brother is alive. She indicated that her maternal grandmother is deceased. She indicated that her maternal grandfather is deceased. She indicated that her paternal grandmother is deceased. She indicated that all of her four daughters are alive.  Social History    Social History   Socioeconomic History   Marital status: Married    Spouse name: Not on file   Number of children: Not on file   Years of education: Not on file   Highest education level: Not on file  Occupational History   Not on file  Tobacco Use   Smoking status: Former    Types: Cigarettes    Quit date: 03/21/2018    Years since quitting: 4.2   Smokeless tobacco: Never  Vaping Use   Vaping Use: Never used  Substance  and Sexual Activity   Alcohol use: Not Currently   Drug use: Never   Sexual activity: Not on file  Other Topics Concern   Not on file  Social History Narrative   Not on file   Social Determinants of Health   Financial Resource Strain: Not on file  Food Insecurity: Not on file  Transportation Needs: Not on file  Physical Activity: Not on file  Stress: Not on file  Social Connections: Not on file  Intimate Partner Violence: Not on file     Review of Systems    General:  No chills, fever, night sweats or weight changes.  Cardiovascular:  No chest pain, dyspnea on exertion, edema, orthopnea, palpitations, paroxysmal nocturnal dyspnea. Dermatological: No rash, lesions/masses Respiratory: No cough, dyspnea Urologic: No hematuria, dysuria Abdominal:   No nausea, vomiting, diarrhea, bright red blood per rectum, melena, or hematemesis Neurologic:  No visual changes, wkns, changes in mental status. All other systems reviewed and are otherwise negative except as noted above.  Physical Exam    VS:  There were no vitals taken for this visit. , BMI There is no height or weight on file to calculate BMI. GEN: Well nourished, well developed, in no acute distress. HEENT: normal. Neck: Supple,  no JVD, carotid bruits, or masses. Cardiac: RRR, no murmurs, rubs, or gallops. No clubbing, cyanosis, edema.  Radials/DP/PT 2+ and equal bilaterally.  Respiratory:  Respirations regular and unlabored, clear to auscultation bilaterally. GI: Soft, nontender, nondistended, BS + x 4. MS: no deformity or atrophy. Skin: warm and dry, no rash. Neuro:  Strength and sensation are intact. Psych: Normal affect.  Accessory Clinical Findings    Recent Labs: No results found for requested labs within last 365 days.   Recent Lipid Panel    Component Value Date/Time   CHOL 289 (H) 03/06/2019 0941   TRIG 130 03/06/2019 0941   HDL 92 03/06/2019 0941   CHOLHDL 3.1 03/06/2019 0941   LDLCALC 175 (H) 03/06/2019 0941    No BP recorded.  {Refresh Note OR Click here to enter BP  :1}***    ECG personally reviewed by me today- *** - No acute changes  Echocardiogram 03/09/2019 IMPRESSIONS     1. Left ventricular ejection fraction, by visual estimation, is 60 to  65%. The left ventricle has normal function. Normal left ventricular size.  There is no left ventricular hypertrophy.   FINDINGS   Left Ventricle: Left ventricular ejection fraction, by visual estimation,  is 60 to 65%. The left ventricle has normal function. There is no left  ventricular hypertrophy. Normal left ventricular size.   Right Ventricle: The right ventricular size is normal. No increase in  right ventricular wall thickness. Global RV systolic function is has  normal systolic function. The tricuspid regurgitant velocity is 2.20 m/s,  and with an assumed right atrial pressure   of 3 mmHg, the estimated right ventricular systolic pressure is normal at  22.4 mmHg.   Left Atrium: Left atrial size was normal in size.   Right Atrium: Right atrial size was normal in size   Pericardium: There is no evidence of pericardial effusion.   Mitral Valve: The mitral valve is normal in structure. No evidence of  mitral valve stenosis by  observation. No evidence of mitral valve  regurgitation.   Tricuspid Valve: The tricuspid valve is normal in structure. Tricuspid  valve regurgitation is trivial by color flow Doppler.   Aortic Valve: The aortic valve is normal in structure. Aortic valve  regurgitation was not visualized by color flow Doppler. The aortic valve  is structurally normal, with no evidence of sclerosis or stenosis.   Pulmonic Valve: The pulmonic valve was normal in structure. Pulmonic valve  regurgitation is not visualized by color flow Doppler.   Aorta: The aortic root, ascending aorta and aortic arch are all  structurally normal, with no evidence of dilitation or obstruction.   Venous: The inferior vena cava is normal in size with greater than 50%  respiratory variability, suggesting right atrial pressure of 3 mmHg.   IAS/Shunts: No atrial level shunt detected by color flow Doppler. No  ventricular septal defect is seen or detected. There is no evidence of an  atrial septal defect.     Coronary CTA 09/16/2020 EXAM: Cardiac/Coronary  CTA   TECHNIQUE: The patient was scanned on a [employer].   FINDINGS: A 100 kV prospective scan was triggered in the descending thoracic aorta at 111 HU's. Axial non-contrast 3 mm slices were carried out through the heart. The data set was analyzed on a dedicated work station and scored using the [place]. Gantry rotation speed was 250 msecs and collimation was .6 mm. 0.8 mg of sl NTG was given. The 3D data set was reconstructed in 5% intervals of the 67-82 % of the R-R cycle. Diastolic phases were analyzed on a dedicated work station using MPR, MIP and VRT modes. The patient received 80 cc of contrast.   Aorta:  Normal size.  No calcifications.  No dissection.   Aortic Valve:  Trileaflet.  No calcifications.   Coronary Arteries:  Normal coronary origin.  Right dominance.   RCA is a large dominant artery that gives rise to PDA and PLA.  There is no plaque. Distal vessel is poorly filled.   Left main is a large artery that gives rise to LAD, small ramus and LCX arteries.   LAD is a large vessel that has no plaque. The distal segment demonstrates filling artifact.   LCX is a non-dominant artery that gives rise to one small caliber OM1 branch. There is no plaque. Distal vessel is poorly filled.   Other findings:   Normal pulmonary vein drainage into the left atrium.   Normal left atrial appendage without a thrombus.   Normal size of the pulmonary artery.   Please see radiology report for non cardiac findings.   IMPRESSION: 1. Coronary calcium score of 0. This was 0 percentile for age and sex matched control.   2. Normal coronary origin with right dominance.   3. No evidence of CAD in proximal and mid vessels.     Electronically Signed   By: Candee Furbish MD   On: 09/16/2020 14:27  Assessment & Plan   1.  Jossie Ng. Fredi Hurtado NP-C     06/21/2022, 6:13 AM [place] [address] Office [phone] Fax [phone]    I spent***minutes examining this patient, reviewing medications, and using patient centered shared decision making involving her cardiac care.  Prior to her visit I spent greater than 20 minutes reviewing her past medical history,  medications, and prior cardiac tests.

## 2022-06-22 ENCOUNTER — Ambulatory Visit: Payer: Medicaid Other | Attending: Cardiology | Admitting: General Practice

## 2022-08-22 ENCOUNTER — Ambulatory Visit: Payer: Medicaid Other | Admitting: Cardiology
